# Patient Record
Sex: Male | Born: 1950 | Race: Black or African American | Hispanic: No | Marital: Married | Smoking: Former smoker
Health system: Southern US, Community
[De-identification: ages and names within clinical notes are randomized; demographics above are authoritative.]

## PROBLEM LIST (undated history)

## (undated) DIAGNOSIS — Z87448 Personal history of other diseases of urinary system: Secondary | ICD-10-CM

## (undated) DIAGNOSIS — E785 Hyperlipidemia, unspecified: Secondary | ICD-10-CM

## (undated) DIAGNOSIS — M1990 Unspecified osteoarthritis, unspecified site: Secondary | ICD-10-CM

## (undated) DIAGNOSIS — Z85048 Personal history of other malignant neoplasm of rectum, rectosigmoid junction, and anus: Secondary | ICD-10-CM

## (undated) DIAGNOSIS — E119 Type 2 diabetes mellitus without complications: Secondary | ICD-10-CM

## (undated) DIAGNOSIS — N529 Male erectile dysfunction, unspecified: Secondary | ICD-10-CM

## (undated) DIAGNOSIS — Z973 Presence of spectacles and contact lenses: Secondary | ICD-10-CM

## (undated) DIAGNOSIS — I10 Essential (primary) hypertension: Secondary | ICD-10-CM

## (undated) DIAGNOSIS — N134 Hydroureter: Secondary | ICD-10-CM

## (undated) DIAGNOSIS — R319 Hematuria, unspecified: Secondary | ICD-10-CM

## (undated) HISTORY — PX: OTHER SURGICAL HISTORY: SHX169

---

## 1992-04-14 HISTORY — PX: BOWEL RESECTION: SHX1257

## 1997-08-23 ENCOUNTER — Other Ambulatory Visit: Admission: RE | Admit: 1997-08-23 | Discharge: 1997-08-23 | Payer: Self-pay | Admitting: Internal Medicine

## 1997-10-02 ENCOUNTER — Encounter: Admission: RE | Admit: 1997-10-02 | Discharge: 1997-12-31 | Payer: Self-pay | Admitting: Internal Medicine

## 1998-04-16 ENCOUNTER — Ambulatory Visit (HOSPITAL_BASED_OUTPATIENT_CLINIC_OR_DEPARTMENT_OTHER): Admission: RE | Admit: 1998-04-16 | Discharge: 1998-04-16 | Payer: Self-pay | Admitting: General Surgery

## 1998-04-17 ENCOUNTER — Emergency Department (HOSPITAL_COMMUNITY): Admission: EM | Admit: 1998-04-17 | Discharge: 1998-04-17 | Payer: Self-pay | Admitting: Emergency Medicine

## 1998-04-20 ENCOUNTER — Encounter: Payer: Self-pay | Admitting: General Surgery

## 1998-04-20 ENCOUNTER — Ambulatory Visit (HOSPITAL_COMMUNITY): Admission: RE | Admit: 1998-04-20 | Discharge: 1998-04-20 | Payer: Self-pay | Admitting: General Surgery

## 1998-04-25 ENCOUNTER — Encounter: Admission: RE | Admit: 1998-04-25 | Discharge: 1998-07-24 | Payer: Self-pay | Admitting: *Deleted

## 1998-05-04 ENCOUNTER — Ambulatory Visit (HOSPITAL_COMMUNITY): Admission: RE | Admit: 1998-05-04 | Discharge: 1998-05-04 | Payer: Self-pay | Admitting: General Surgery

## 1998-05-04 ENCOUNTER — Encounter: Payer: Self-pay | Admitting: General Surgery

## 1998-07-25 ENCOUNTER — Encounter: Admission: RE | Admit: 1998-07-25 | Discharge: 1998-10-23 | Payer: Self-pay | Admitting: *Deleted

## 1998-08-03 ENCOUNTER — Emergency Department (HOSPITAL_COMMUNITY): Admission: EM | Admit: 1998-08-03 | Discharge: 1998-08-03 | Payer: Self-pay | Admitting: Emergency Medicine

## 1998-08-06 ENCOUNTER — Inpatient Hospital Stay (HOSPITAL_COMMUNITY): Admission: EM | Admit: 1998-08-06 | Discharge: 1998-08-07 | Payer: Self-pay | Admitting: Emergency Medicine

## 1998-10-19 ENCOUNTER — Ambulatory Visit (HOSPITAL_BASED_OUTPATIENT_CLINIC_OR_DEPARTMENT_OTHER): Admission: RE | Admit: 1998-10-19 | Discharge: 1998-10-19 | Payer: Self-pay | Admitting: General Surgery

## 1998-11-23 ENCOUNTER — Ambulatory Visit: Admission: RE | Admit: 1998-11-23 | Discharge: 1998-11-23 | Payer: Self-pay | Admitting: Oncology

## 1998-11-23 ENCOUNTER — Encounter: Payer: Self-pay | Admitting: Oncology

## 1999-04-29 ENCOUNTER — Encounter: Admission: RE | Admit: 1999-04-29 | Discharge: 1999-04-29 | Payer: Self-pay | Admitting: Hematology and Oncology

## 1999-04-29 ENCOUNTER — Encounter: Payer: Self-pay | Admitting: Hematology and Oncology

## 1999-05-14 ENCOUNTER — Other Ambulatory Visit: Admission: RE | Admit: 1999-05-14 | Discharge: 1999-05-14 | Payer: Self-pay | Admitting: General Surgery

## 1999-07-29 ENCOUNTER — Encounter: Admission: RE | Admit: 1999-07-29 | Discharge: 1999-07-29 | Payer: Self-pay | Admitting: Oncology

## 1999-07-29 ENCOUNTER — Encounter: Payer: Self-pay | Admitting: Oncology

## 1999-10-21 ENCOUNTER — Encounter: Payer: Self-pay | Admitting: Oncology

## 1999-10-21 ENCOUNTER — Ambulatory Visit (HOSPITAL_COMMUNITY): Admission: RE | Admit: 1999-10-21 | Discharge: 1999-10-21 | Payer: Self-pay | Admitting: Oncology

## 2000-04-23 ENCOUNTER — Encounter: Payer: Self-pay | Admitting: Oncology

## 2000-04-23 ENCOUNTER — Encounter: Admission: RE | Admit: 2000-04-23 | Discharge: 2000-04-23 | Payer: Self-pay | Admitting: Oncology

## 2000-07-21 ENCOUNTER — Ambulatory Visit (HOSPITAL_COMMUNITY): Admission: RE | Admit: 2000-07-21 | Discharge: 2000-07-21 | Payer: Self-pay | Admitting: Gastroenterology

## 2000-10-06 ENCOUNTER — Encounter: Payer: Self-pay | Admitting: Emergency Medicine

## 2000-10-06 ENCOUNTER — Emergency Department (HOSPITAL_COMMUNITY): Admission: EM | Admit: 2000-10-06 | Discharge: 2000-10-07 | Payer: Self-pay | Admitting: Emergency Medicine

## 2000-10-13 ENCOUNTER — Encounter: Payer: Self-pay | Admitting: Oncology

## 2000-10-13 ENCOUNTER — Ambulatory Visit (HOSPITAL_COMMUNITY): Admission: RE | Admit: 2000-10-13 | Discharge: 2000-10-13 | Payer: Self-pay | Admitting: Oncology

## 2001-03-29 ENCOUNTER — Encounter: Payer: Self-pay | Admitting: Oncology

## 2001-03-29 ENCOUNTER — Ambulatory Visit (HOSPITAL_COMMUNITY): Admission: RE | Admit: 2001-03-29 | Discharge: 2001-03-29 | Payer: Self-pay | Admitting: Oncology

## 2002-03-28 ENCOUNTER — Ambulatory Visit (HOSPITAL_COMMUNITY): Admission: RE | Admit: 2002-03-28 | Discharge: 2002-03-28 | Payer: Self-pay | Admitting: Oncology

## 2002-03-28 ENCOUNTER — Encounter: Payer: Self-pay | Admitting: Oncology

## 2003-06-16 ENCOUNTER — Ambulatory Visit (HOSPITAL_COMMUNITY): Admission: RE | Admit: 2003-06-16 | Discharge: 2003-06-16 | Payer: Self-pay | Admitting: Gastroenterology

## 2004-03-22 ENCOUNTER — Ambulatory Visit: Payer: Self-pay | Admitting: Oncology

## 2005-03-21 ENCOUNTER — Ambulatory Visit: Payer: Self-pay | Admitting: Oncology

## 2006-03-19 ENCOUNTER — Ambulatory Visit: Payer: Self-pay | Admitting: Oncology

## 2006-04-29 LAB — FECAL OCCULT BLOOD, GUAIAC: Occult Blood: NEGATIVE

## 2007-03-18 ENCOUNTER — Ambulatory Visit: Payer: Self-pay | Admitting: Oncology

## 2007-09-22 ENCOUNTER — Ambulatory Visit (HOSPITAL_BASED_OUTPATIENT_CLINIC_OR_DEPARTMENT_OTHER): Admission: RE | Admit: 2007-09-22 | Discharge: 2007-09-22 | Payer: Self-pay | Admitting: Urology

## 2007-09-22 HISTORY — PX: OTHER SURGICAL HISTORY: SHX169

## 2008-04-18 ENCOUNTER — Ambulatory Visit: Payer: Self-pay | Admitting: Oncology

## 2009-04-16 ENCOUNTER — Ambulatory Visit: Payer: Self-pay | Admitting: Oncology

## 2010-04-17 ENCOUNTER — Ambulatory Visit: Payer: Self-pay | Admitting: Oncology

## 2010-08-27 NOTE — Op Note (Signed)
NAMESTEVENS, Gerald Knight               ACCOUNT NO.:  [id number]   MEDICAL RECORD NO.:  [id number]          PATIENT TYPE:  AMB   LOCATION:  [facility]                         FACILITY:  [facility]   PHYSICIAN:  Courtney Paris, M.D.DATE OF BIRTH:  1951-02-13   DATE OF PROCEDURE:  09/22/2007  DATE OF DISCHARGE:                               OPERATIVE REPORT   PREOPERATIVE DIAGNOSIS:  Urethral stricture.   POSTOPERATIVE DIAGNOSIS:  Urethral stricture.   OPERATION:  Direct vision internal urethrotomy.   ANESTHESIA:  General.   SURGEON:  Courtney Paris, M.D.   This 60 year old patient presented with hematuria.  He was found to have  a urethral stricture, and he is admitted now for a DVIU.  He had some  kind of posterior bowel resection by Dr. Elige Radon in 1994.  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  He also has cancer of the rectum, treated with  radiation and chemo in 2000, with no evidence of disease.  He has a  family history of diabetes.  He does have some ED and is mildly  diabetic.   The patient was placed on the operating table in the dorsal lithotomy  position.  After satisfactory induction of general anesthesia, he was  prepped and draped with Betadine in the usual sterile fashion.  The  urethral meatus had to be dilated to accept the urethrotome.  This was  done up to 30-French sounds.  The scope was then passed down to the deep  bulbous urethra where a tight stricture was seen.  It was about 4-6  [place] in size.  It just accepted a #3 ureteral catheter.  Pictures were  made of the stricture.  After passing the ureteral catheter through the  stricture at 12 o'clock with the blade, I was able to incise this open  and then passed the scope into the bladder.  The stricture was a little  less than 1-cm long and it was just distal to the external sphincter.  He did have some moderate enlargement of the prostate, but I  saw no  evidence of any posterior urethral valves.  The bladder was entered.  The bladder seemed to be normal, mildly trabeculated.  No lesions seen.  As the scope was withdrawn again the stricture was incised at 12 o'clock  with fairly good hemostasis.  A final picture was made of the open  stricture and the scope was removed.   I tried to pass a #20 regular Foley catheter, but it did not seem to  want to go.  A #18 coude did go easily into the bladder.  The irrigant  was clear.  He was attached to a leg bag and he was sent to the recovery  room in good condition.   The plan is to leave the catheter for 2 weeks.  He will remove it at  that time and I will see him back in 3 weeks time.  He will be covered  with antibiotics for 3 days, and then again when the  catheter is  removed.  Detailed written instructions were given.      Courtney Paris, M.D.  Electronically Signed     HMK/MEDQ  D:  09/22/2007  T:  09/22/2007  Job:  [id number]

## 2011-01-09 LAB — CBC
HCT: 40.1
Hemoglobin: 13.7
MCHC: 34.2
MCV: 87
Platelets: 185
RBC: 4.61
RDW: 14.3
WBC: 4.7

## 2011-01-09 LAB — URINALYSIS, ROUTINE W REFLEX MICROSCOPIC
Bilirubin Urine: NEGATIVE
Glucose, UA: NEGATIVE
Hgb urine dipstick: NEGATIVE
Ketones, ur: NEGATIVE
Nitrite: NEGATIVE
Protein, ur: NEGATIVE
Specific Gravity, Urine: 1.01
Urobilinogen, UA: 0.2
pH: 6

## 2011-01-09 LAB — BASIC METABOLIC PANEL
BUN: 13
CO2: 28
Calcium: 9.6
Chloride: 103
Creatinine, Ser: 1.06
GFR calc Af Amer: >60
GFR calc non Af Amer: >60
Glucose, Bld: 145 — ABNORMAL HIGH
Potassium: 4.3
Sodium: 137

## 2011-01-09 LAB — URINE MICROSCOPIC-ADD ON: WBC, UA: NONE SEEN

## 2011-04-05 ENCOUNTER — Telehealth: Payer: Self-pay | Admitting: *Deleted

## 2011-04-05 NOTE — Telephone Encounter (Signed)
patient confirmed over the phone the new date and time on 05-06-2011 at 10:30am

## 2011-05-05 ENCOUNTER — Encounter: Payer: Self-pay | Admitting: *Deleted

## 2011-05-06 ENCOUNTER — Encounter: Payer: Self-pay | Admitting: Oncology

## 2011-05-06 ENCOUNTER — Ambulatory Visit (HOSPITAL_BASED_OUTPATIENT_CLINIC_OR_DEPARTMENT_OTHER): Payer: 59 | Admitting: Oncology

## 2011-05-06 VITALS — BP 138/89 | HR 62 | Temp 97.0°F | Ht 70.0 in | Wt 173.3 lb

## 2011-05-06 DIAGNOSIS — C210 Malignant neoplasm of anus, unspecified: Secondary | ICD-10-CM

## 2011-05-06 NOTE — Progress Notes (Signed)
OFFICE PROGRESS NOTE   INTERVAL HISTORY:   He returns as scheduled. He reports a good appetite. He denies difficulty with bowel and bladder function. He denies bleeding. He is now taking metformin for diabetes. He reports an intentional weight loss per  Objective:  Vital signs in last 24 hours:  Blood pressure 138/89, pulse 62, temperature 97 F (36.1 C), temperature source Oral, height 5\' 10"  (1.778 m), weight 173 lb 4.8 oz (78.608 kg).    HEENT: Slight prominence of the left compared to the right submandibular gland. Oropharynx without mass. Neck without mass. Lymphatics: No cervical, supraclavicular, axillary, inguinal, or femoral nodes Resp: Lungs clear bilaterally Cardio: Regular rate and rhythm GI: No hepatosplenomegaly, no mass Vascular: No leg edema  Rectal: Radiation hyperpigmentation/hypopigmentation at the perineum. No mass at the anal verge, anal canal, or rectum. The prostate appears enlarged and without nodularity.     Medications: I have reviewed the patient's current medications.  Assessment/Plan: 1. Anal cancer diagnosed in January 2000.  He completed treatment on the RTOG 9811 study.  He remains in clinical remission. 2.   History of Hemoccult-positive stool - status post a colonoscopy by Dr Zettie Pho in March 2010.  A polyp was removed.  He was referred for a repeat colonoscopy in 2012. We will followup on this report.  Disposition:  He remains in clinical remission from the anal cancer. He would like to continue followup at the cancer Center. He will return for an office visit in one year.   Lucile Shutters, MD  05/06/2011  11:57 AM

## 2011-08-27 ENCOUNTER — Ambulatory Visit
Admission: RE | Admit: 2011-08-27 | Discharge: 2011-08-27 | Disposition: A | Discharge status: Home / Self Care | Payer: 59 | Source: Ambulatory Visit | Attending: Internal Medicine | Admitting: Internal Medicine

## 2011-08-27 ENCOUNTER — Other Ambulatory Visit: Payer: Self-pay | Admitting: Internal Medicine

## 2011-08-27 DIAGNOSIS — R1900 Intra-abdominal and pelvic swelling, mass and lump, unspecified site: Secondary | ICD-10-CM

## 2011-08-28 ENCOUNTER — Ambulatory Visit
Admission: RE | Admit: 2011-08-28 | Discharge: 2011-08-28 | Disposition: A | Discharge status: Home / Self Care | Payer: 59 | Source: Ambulatory Visit | Attending: Internal Medicine | Admitting: Internal Medicine

## 2011-08-28 ENCOUNTER — Other Ambulatory Visit: Payer: 59

## 2011-08-28 MED ORDER — IOHEXOL 300 MG/ML  SOLN
100.0000 mL | Freq: Once | INTRAMUSCULAR | Status: AC | PRN
  Administered 2011-08-28: 100 mL via INTRAVENOUS

## 2012-04-12 ENCOUNTER — Telehealth: Payer: Self-pay | Admitting: Oncology

## 2012-04-12 ENCOUNTER — Other Ambulatory Visit: Payer: Self-pay | Admitting: *Deleted

## 2012-04-12 NOTE — Telephone Encounter (Signed)
Called patient left message appt has been moved from 05/04/12 to 2/20th MD only

## 2012-05-04 ENCOUNTER — Ambulatory Visit: Payer: 59 | Admitting: Oncology

## 2012-06-03 ENCOUNTER — Encounter: Payer: Self-pay | Admitting: Oncology

## 2012-06-03 ENCOUNTER — Telehealth: Payer: Self-pay | Admitting: Oncology

## 2012-06-03 ENCOUNTER — Ambulatory Visit (HOSPITAL_BASED_OUTPATIENT_CLINIC_OR_DEPARTMENT_OTHER): Payer: 59 | Admitting: Oncology

## 2012-06-03 VITALS — BP 139/85 | HR 63 | Temp 97.0°F | Resp 20 | Ht 70.0 in | Wt 172.4 lb

## 2012-06-03 DIAGNOSIS — C210 Malignant neoplasm of anus, unspecified: Secondary | ICD-10-CM | POA: Insufficient documentation

## 2012-06-03 NOTE — Progress Notes (Signed)
   [facility]    OFFICE PROGRESS NOTE   INTERVAL HISTORY:   He returns as scheduled. He feels well. He is followed closely by Dr. Valentina Lucks for management of diabetes. He is now followed by Dr. Margarita Grizzle in urology. No complaint.  Objective:  Vital signs in last 24 hours:  Blood pressure 139/85, pulse 63, temperature 97 F (36.1 C), temperature source Oral, resp. rate 20, height 5\' 10"  (1.778 m), weight 172 lb 6.4 oz (78.2 kg).    HEENT: Neck without mass Lymphatics: No cervical, supraclavicular, axillary, or inguinal nodes Resp: Lungs clear bilaterally Cardio: Regular rate and rhythm GI: No hepatosplenomegaly, nontender, radiation skin changes at the perineum. No nodules. Anal canal and rectum without mass. The prostate appears diffusely firm Vascular: No leg edema    Medications: I have reviewed the patient's current medications.  Assessment/Plan: 1. Anal cancer diagnosed in January 2000. He completed treatment on the RTOG 9811 study. He remains in clinical remission. 2. History of Hemoccult-positive stool - status post a colonoscopy by Dr Zettie Pho in March 2010. A polyp was removed. He was referred for a repeat colonoscopy in 2012.   Disposition:  Mr. Gerald Knight remains in clinical remission from anal cancer. He would like to continue followup at the cancer Center. He will return for an office visit in one year.   Thornton Papas, MD  06/03/2012  10:56 AM

## 2012-09-20 ENCOUNTER — Other Ambulatory Visit: Payer: Self-pay | Admitting: Urology

## 2012-09-20 ENCOUNTER — Encounter (HOSPITAL_BASED_OUTPATIENT_CLINIC_OR_DEPARTMENT_OTHER): Payer: Self-pay | Admitting: *Deleted

## 2012-09-27 ENCOUNTER — Ambulatory Visit (HOSPITAL_BASED_OUTPATIENT_CLINIC_OR_DEPARTMENT_OTHER): Admission: RE | Admit: 2012-09-27 | Payer: 59 | Source: Ambulatory Visit | Admitting: Urology

## 2012-09-27 ENCOUNTER — Encounter (HOSPITAL_BASED_OUTPATIENT_CLINIC_OR_DEPARTMENT_OTHER): Admission: RE | Payer: Self-pay | Source: Ambulatory Visit

## 2012-09-27 HISTORY — DX: Personal history of other malignant neoplasm of rectum, rectosigmoid junction, and anus: Z85.048

## 2012-09-27 SURGERY — CYSTOSCOPY, WITH URETHRAL DILATION
Anesthesia: General

## 2013-05-31 ENCOUNTER — Ambulatory Visit: Payer: 59 | Admitting: Oncology

## 2013-10-27 ENCOUNTER — Other Ambulatory Visit: Payer: Self-pay | Admitting: Urology

## 2013-11-16 ENCOUNTER — Encounter (HOSPITAL_BASED_OUTPATIENT_CLINIC_OR_DEPARTMENT_OTHER): Payer: Self-pay | Admitting: *Deleted

## 2013-11-16 NOTE — Progress Notes (Signed)
NPO AFTER MN. ARRIVE AT 0830. NEEDS ISTAT 8. WILL TAKE LIPITOR AM DOS W/ SIPS OF WATER.

## 2013-11-23 ENCOUNTER — Encounter (HOSPITAL_BASED_OUTPATIENT_CLINIC_OR_DEPARTMENT_OTHER)
Admission: RE | Disposition: A | Discharge status: Home / Self Care | Payer: Self-pay | Source: Ambulatory Visit | Attending: Urology

## 2013-11-23 ENCOUNTER — Ambulatory Visit (HOSPITAL_BASED_OUTPATIENT_CLINIC_OR_DEPARTMENT_OTHER)
Admission: RE | Admit: 2013-11-23 | Discharge: 2013-11-23 | Disposition: A | Discharge status: Home / Self Care | Payer: PRIVATE HEALTH INSURANCE | Source: Ambulatory Visit | Attending: Urology | Admitting: Urology

## 2013-11-23 ENCOUNTER — Encounter (HOSPITAL_BASED_OUTPATIENT_CLINIC_OR_DEPARTMENT_OTHER): Payer: PRIVATE HEALTH INSURANCE | Admitting: Anesthesiology

## 2013-11-23 ENCOUNTER — Ambulatory Visit (HOSPITAL_BASED_OUTPATIENT_CLINIC_OR_DEPARTMENT_OTHER): Payer: PRIVATE HEALTH INSURANCE | Admitting: Anesthesiology

## 2013-11-23 ENCOUNTER — Encounter (HOSPITAL_BASED_OUTPATIENT_CLINIC_OR_DEPARTMENT_OTHER): Payer: Self-pay | Admitting: *Deleted

## 2013-11-23 DIAGNOSIS — IMO0002 Reserved for concepts with insufficient information to code with codable children: Secondary | ICD-10-CM | POA: Diagnosis not present

## 2013-11-23 DIAGNOSIS — Z888 Allergy status to other drugs, medicaments and biological substances status: Secondary | ICD-10-CM | POA: Diagnosis not present

## 2013-11-23 DIAGNOSIS — R3129 Other microscopic hematuria: Secondary | ICD-10-CM | POA: Insufficient documentation

## 2013-11-23 DIAGNOSIS — Z9221 Personal history of antineoplastic chemotherapy: Secondary | ICD-10-CM | POA: Diagnosis not present

## 2013-11-23 DIAGNOSIS — N3289 Other specified disorders of bladder: Secondary | ICD-10-CM | POA: Diagnosis not present

## 2013-11-23 DIAGNOSIS — E119 Type 2 diabetes mellitus without complications: Secondary | ICD-10-CM | POA: Diagnosis not present

## 2013-11-23 DIAGNOSIS — Z923 Personal history of irradiation: Secondary | ICD-10-CM | POA: Insufficient documentation

## 2013-11-23 DIAGNOSIS — E785 Hyperlipidemia, unspecified: Secondary | ICD-10-CM | POA: Diagnosis not present

## 2013-11-23 DIAGNOSIS — Z85048 Personal history of other malignant neoplasm of rectum, rectosigmoid junction, and anus: Secondary | ICD-10-CM | POA: Insufficient documentation

## 2013-11-23 DIAGNOSIS — Z91041 Radiographic dye allergy status: Secondary | ICD-10-CM | POA: Insufficient documentation

## 2013-11-23 HISTORY — PX: CYSTOSCOPY W/ RETROGRADES: SHX1426

## 2013-11-23 HISTORY — DX: Hyperlipidemia, unspecified: E78.5

## 2013-11-23 HISTORY — DX: Personal history of other diseases of urinary system: Z87.448

## 2013-11-23 HISTORY — DX: Hematuria, unspecified: R31.9

## 2013-11-23 HISTORY — DX: Type 2 diabetes mellitus without complications: E11.9

## 2013-11-23 LAB — POCT I-STAT, CHEM 8
BUN: 15 mg/dL (ref 6–23)
Calcium, Ion: 1.26 mmol/L (ref 1.13–1.30)
Chloride: 103 mEq/L (ref 96–112)
Creatinine, Ser: 1 mg/dL (ref 0.50–1.35)
Glucose, Bld: 118 mg/dL — ABNORMAL HIGH (ref 70–99)
HCT: 43 % (ref 39.0–52.0)
Hemoglobin: 14.6 g/dL (ref 13.0–17.0)
Potassium: 3.9 mEq/L (ref 3.7–5.3)
Sodium: 141 mEq/L (ref 137–147)
TCO2: 24 mmol/L (ref 0–100)

## 2013-11-23 LAB — GLUCOSE, CAPILLARY: Glucose-Capillary: 126 mg/dL — ABNORMAL HIGH (ref 70–99)

## 2013-11-23 SURGERY — CYSTOSCOPY, WITH RETROGRADE PYELOGRAM
Anesthesia: General | Site: Bladder | Laterality: Bilateral

## 2013-11-23 MED ORDER — FENTANYL CITRATE 0.05 MG/ML IJ SOLN
INTRAMUSCULAR | Status: AC
  Filled 2013-11-23: qty 6

## 2013-11-23 MED ORDER — STERILE WATER FOR IRRIGATION IR SOLN
Status: DC | PRN
  Administered 2013-11-23: 3000 mL

## 2013-11-23 MED ORDER — MIDAZOLAM HCL 2 MG/2ML IJ SOLN
INTRAMUSCULAR | Status: AC
  Filled 2013-11-23: qty 2

## 2013-11-23 MED ORDER — LACTATED RINGERS IV SOLN
INTRAVENOUS | Status: DC
  Filled 2013-11-23: qty 1000

## 2013-11-23 MED ORDER — METOCLOPRAMIDE HCL 5 MG/ML IJ SOLN
10.0000 mg | Freq: Once | INTRAMUSCULAR | Status: DC | PRN
  Filled 2013-11-23: qty 2

## 2013-11-23 MED ORDER — GENTAMICIN IN SALINE 1.6-0.9 MG/ML-% IV SOLN
80.0000 mg | INTRAVENOUS | Status: AC
  Administered 2013-11-23: 390 mg via INTRAVENOUS
  Filled 2013-11-23: qty 50

## 2013-11-23 MED ORDER — FENTANYL CITRATE 0.05 MG/ML IJ SOLN
25.0000 ug | INTRAMUSCULAR | Status: DC | PRN
  Filled 2013-11-23: qty 1

## 2013-11-23 MED ORDER — OXYCODONE-ACETAMINOPHEN 5-325 MG PO TABS: 1.0000 | ORAL_TABLET | Freq: Four times a day (QID) | ORAL | Status: DC | PRN

## 2013-11-23 MED ORDER — MEPERIDINE HCL 25 MG/ML IJ SOLN
6.2500 mg | INTRAMUSCULAR | Status: DC | PRN
  Filled 2013-11-23: qty 1

## 2013-11-23 MED ORDER — LIDOCAINE HCL (CARDIAC) 20 MG/ML IV SOLN
INTRAVENOUS | Status: DC | PRN
  Administered 2013-11-23: 60 mg via INTRAVENOUS

## 2013-11-23 MED ORDER — MIDAZOLAM HCL 5 MG/5ML IJ SOLN
INTRAMUSCULAR | Status: DC | PRN
  Administered 2013-11-23: 2 mg via INTRAVENOUS

## 2013-11-23 MED ORDER — DEXAMETHASONE SODIUM PHOSPHATE 4 MG/ML IJ SOLN
INTRAMUSCULAR | Status: DC | PRN
  Administered 2013-11-23: 8 mg via INTRAVENOUS

## 2013-11-23 MED ORDER — LACTATED RINGERS IV SOLN
INTRAVENOUS | Status: DC | PRN
  Administered 2013-11-23: 09:00:00 via INTRAVENOUS

## 2013-11-23 MED ORDER — CEPHALEXIN 500 MG PO CAPS: 500.0000 mg | ORAL_CAPSULE | Freq: Two times a day (BID) | ORAL | Status: DC

## 2013-11-23 MED ORDER — ONDANSETRON HCL 4 MG/2ML IJ SOLN
INTRAMUSCULAR | Status: DC | PRN
  Administered 2013-11-23: 4 mg via INTRAVENOUS

## 2013-11-23 MED ORDER — BELLADONNA ALKALOIDS-OPIUM 16.2-60 MG RE SUPP
RECTAL | Status: AC
  Filled 2013-11-23: qty 1

## 2013-11-23 MED ORDER — SENNOSIDES-DOCUSATE SODIUM 8.6-50 MG PO TABS: 1.0000 | ORAL_TABLET | Freq: Two times a day (BID) | ORAL | Status: AC

## 2013-11-23 MED ORDER — PROPOFOL 10 MG/ML IV BOLUS
INTRAVENOUS | Status: DC | PRN
  Administered 2013-11-23: 180 mg via INTRAVENOUS

## 2013-11-23 MED ORDER — FENTANYL CITRATE 0.05 MG/ML IJ SOLN
INTRAMUSCULAR | Status: DC | PRN
  Administered 2013-11-23 (×2): 50 ug via INTRAVENOUS

## 2013-11-23 SURGICAL SUPPLY — 25 items
BAG URO CATCHER STRL LF (DRAPE) ×2 IMPLANT
BALLN NEPHROSTOMY (BALLOONS) ×2
BALLOON NEPHROSTOMY (BALLOONS) IMPLANT
BASKET ZERO TIP NITINOL 2.4FR (BASKET) IMPLANT
BSKT STON RTRVL ZERO TP 2.4FR (BASKET)
CATH FOLEY 2W COUNCIL 20FR 5CC (CATHETERS) ×1 IMPLANT
CATH FOLEY 2WAY SLVR  5CC 14FR (CATHETERS) ×1
CATH FOLEY 2WAY SLVR 5CC 14FR (CATHETERS) IMPLANT
CATH INTERMIT  6FR 70CM (CATHETERS) ×1 IMPLANT
CLOTH BEACON ORANGE TIMEOUT ST (SAFETY) ×2 IMPLANT
DRAPE CAMERA CLOSED 9X96 (DRAPES) ×2 IMPLANT
GLOVE BIO SURGEON STRL SZ 6 (GLOVE) ×1 IMPLANT
GLOVE BIO SURGEON STRL SZ7.5 (GLOVE) ×2 IMPLANT
GLOVE INDICATOR 6.5 STRL GRN (GLOVE) ×1 IMPLANT
GOWN PREVENTION PLUS XLARGE (GOWN DISPOSABLE) ×1 IMPLANT
GOWN STRL NON-REIN LRG LVL3 (GOWN DISPOSABLE) ×2 IMPLANT
GOWN STRL REUS W/ TWL LRG LVL3 (GOWN DISPOSABLE) IMPLANT
GOWN STRL REUS W/ TWL XL LVL3 (GOWN DISPOSABLE) IMPLANT
GOWN STRL REUS W/TWL LRG LVL3 (GOWN DISPOSABLE) ×2
GOWN STRL REUS W/TWL XL LVL3 (GOWN DISPOSABLE) ×2
GUIDEWIRE ANG ZIPWIRE 038X150 (WIRE) IMPLANT
GUIDEWIRE STR DUAL SENSOR (WIRE) ×2 IMPLANT
IV NS IRRIG 3000ML ARTHROMATIC (IV SOLUTION) ×1 IMPLANT
PACK CYSTOSCOPY (CUSTOM PROCEDURE TRAY) ×2 IMPLANT
WATER STERILE IRR 3000ML UROMA (IV SOLUTION) ×1 IMPLANT

## 2013-11-23 NOTE — Brief Op Note (Signed)
11/23/2013  10:37 AM  PATIENT:  Gerald Knight  63 y.o. male  PRE-OPERATIVE DIAGNOSIS:  HEMATURIA, HISTORY OF URETHRAL STRICTURE  POST-OPERATIVE DIAGNOSIS:  HEMATURIA, HISTORY OF URETHRAL STRICTURE  PROCEDURE:  Procedure(s): CYSTOSCOPY WITH RETROGRADE PYELOGRAM,  URETHRAL DILATION (Bilateral)  SURGEON:  Surgeon(s) and Role:    * Alexis Frock, MD - Primary  PHYSICIAN ASSISTANT:   ASSISTANTS: none   ANESTHESIA:   general  EBL:     BLOOD ADMINISTERED:none  DRAINS: 78F foley to straight drain   LOCAL MEDICATIONS USED:  NONE  SPECIMEN:  Source of Specimen:  none  DISPOSITION OF SPECIMEN:  N/A  COUNTS:  YES  TOURNIQUET:  * No tourniquets in log *  DICTATION: .Other Dictation: Dictation Number [id number]  PLAN OF CARE: Discharge to home after PACU  PATIENT DISPOSITION:  PACU - hemodynamically stable.   Delay start of Pharmacological VTE agent (>24hrs) due to surgical blood loss or risk of bleeding: not applicable

## 2013-11-23 NOTE — Anesthesia Postprocedure Evaluation (Signed)
  Anesthesia Post-op Note  Patient: Gerald Knight  Procedure(s) Performed: Procedure(s) (LRB): CYSTOSCOPY WITH RETROGRADE PYELOGRAM,  URETHRAL DILATION (Bilateral)  Patient Location: PACU  Anesthesia Type: General  Level of Consciousness: awake and alert   Airway and Oxygen Therapy: Patient Spontanous Breathing  Post-op Pain: mild  Post-op Assessment: Post-op Vital signs reviewed, Patient's Cardiovascular Status Stable, Respiratory Function Stable, Patent Airway and No signs of Nausea or vomiting  Last Vitals:  Filed Vitals:   11/23/13 1130  BP: 163/93  Pulse: 58  Temp:   Resp: 14    Post-op Vital Signs: stable   Complications: No apparent anesthesia complications

## 2013-11-23 NOTE — Transfer of Care (Signed)
Immediate Anesthesia Transfer of Care Note  Patient: Gerald Knight  Procedure(s) Performed: Procedure(s) (LRB): CYSTOSCOPY WITH RETROGRADE PYELOGRAM,  URETHRAL DILATION (Bilateral)  Patient Location: PACU  Anesthesia Type: General  Level of Consciousness: awake, oriented, sedated and patient cooperative  Airway & Oxygen Therapy: Patient Spontanous Breathing and Patient connected to face mask oxygen  Post-op Assessment: Report given to PACU RN and Post -op Vital signs reviewed and stable  Post vital signs: Reviewed and stable  Complications: No apparent anesthesia complications

## 2013-11-23 NOTE — Discharge Instructions (Signed)
CYSTOSCOPY HOME CARE INSTRUCTIONS  Activity: Rest for the remainder of the day.  Do not drive or operate equipment today.  You may resume normal activities in one to two days as instructed by your physician.   Meals: Drink plenty of liquids and eat light foods such as gelatin or soup this evening.  You may return to a normal meal plan tomorrow.  Return to Work: You may return to work in one to two days or as instructed by your physician.  Special Instructions / Symptoms: Call your physician if any of these symptoms occur:   -persistent or heavy bleeding  -bleeding which continues after first few urination  -large blood clots that are difficult to pass  -urine stream diminishes or stops completely  -fever equal to or higher than 101 degrees Farenheit.  -cloudy urine with a strong, foul odor  -severe pain  Females should always wipe from front to back after elimination.  You may feel some burning pain when you urinate.  This should disappear with time.  Applying moist heat to the lower abdomen or a hot tub bath may help relieve the pain. \  Follow-Up / Date of Return Visit to Your Physician:  *** Call for an appointment to arrange follow-up.  Patient Signature:  ________________________________________________________  Nurse's Signature:  ________________________________________________________  Indwelling Urinary Catheter Care You have been given a flexible tube (catheter) used to drain the bladder. Catheters are often used when a person has difficulty urinating due to blockage, bleeding, infection, or inability to control bladder or bowel movements (incontinence). A catheter requires daily care to prevent infection and blockage. HOME CARE INSTRUCTIONS  Do the following to reduce the risk of infection. Antibiotic medicines cannot prevent infections. Limit the number of bacteria entering your bladder  Wash your hands for 2 minutes with soapy water before and after handling the  catheter.  Wash your bottom and the entire catheter twice daily, as well as after each bowel movement. Wash the tip of the penis or just above the vaginal opening with soap and warm water, rinse, and then wash the rectal area. Always wash from front to back.  When changing from the leg bag to overnight bag or from the overnight bag to leg bag, thoroughly clean the end of the catheter where it connects to the tubing with an alcohol wipe.  Clean the leg bag and overnight bag daily after use. Replace your drainage bags weekly.  Always keep the tubing and bag below the level of your bladder. This allows your urine to drain properly. Lifting the bag or tubing above the level of your bladder will cause dirty urine to flow back into your bladder. If you must briefly lift the bag higher than your bladder, pinch the catheter or tubing to prevent backflow.  Drink enough water and fluids to keep your urine clear or pale yellow, or as directed by your caregiver. This will flush bacteria out of the bladder. Protect tissues from injury  Attach the catheter to your leg so there is no tension on the catheter. Use adhesive tape or a leg strap. If you are using adhesive tape, remove any sticky residue left behind by the previous tape you used.  Place your leg bag on your lower leg. Fasten the straps securely and comfortably.  Do not remove the catheter yourself unless you have been instructed how to do so. Keep the urinary pathway open  Check throughout the day to be sure your catheter is working and urine  is draining freely. Make sure the tubing does not become kinked.  Do not let the drainage bag overfill. SEEK IMMEDIATE MEDICAL CARE IF:   The catheter becomes blocked. Urine is not draining.  Urine is leaking.  You have any pain.  You have a fever. Document Released: 03/31/2005 Document Revised: 03/17/2012 Document Reviewed: 08/30/2009 [facility] Patient Information 2015 [place], [place]. This  information is not intended to replace advice given to you by your health care provider. Make sure you discuss any questions you have with your health care provider.  Post Anesthesia Home Care Instructions  Activity: Get plenty of rest for the remainder of the day. A responsible adult should stay with you for 24 hours following the procedure.  For the next 24 hours, DO NOT: -Drive a car -Paediatric nurse -Drink alcoholic beverages -Take any medication unless instructed by your physician -Make any legal decisions or sign important papers.  Meals: Start with liquid foods such as gelatin or soup. Progress to regular foods as tolerated. Avoid greasy, spicy, heavy foods. If nausea and/or vomiting occur, drink only clear liquids until the nausea and/or vomiting subsides. Call your physician if vomiting continues.  Special Instructions/Symptoms: Your throat may feel dry or sore from the anesthesia or the breathing tube placed in your throat during surgery. If this causes discomfort, gargle with warm salt water. The discomfort should disappear within 24 hours.

## 2013-11-23 NOTE — Anesthesia Procedure Notes (Signed)
Procedure Name: LMA Insertion Date/Time: 11/23/2013 10:04 AM Performed by: Denna Haggard D Pre-anesthesia Checklist: Patient identified, Emergency Drugs available, Suction available and Patient being monitored Patient Re-evaluated:Patient Re-evaluated prior to inductionOxygen Delivery Method: Circle System Utilized Preoxygenation: Pre-oxygenation with 100% oxygen Intubation Type: IV induction Ventilation: Mask ventilation without difficulty LMA: LMA inserted LMA Size: 4.0 Number of attempts: 1 Airway Equipment and Method: bite block Placement Confirmation: positive ETCO2 Tube secured with: Tape Dental Injury: Teeth and Oropharynx as per pre-operative assessment

## 2013-11-23 NOTE — H&P (Signed)
Gerald Knight is an 63 y.o. male.    Chief Complaint: Pre-Op Cystoscopy, Retrograde Pyelograms, Possible Urethral Dilation  HPI:     1-  Urethral Stricture - s/p incision suspect valves 1994, DVIU 2009. PVR 2015 "39mL".  Location bulbar urethra, has previously prevented office cysto.  2 - MIcroscopic Hematuria - Former 10PY smoker, s/ p chemo-XRT for rectal cancer. Eval 2015 with CT Urogram (cysts only), Cysto (pending).  3 - Lower Urinary Tract Symptoms - Lon h/o irritative symptoms not improved with several anticholinergic trials. PVR 2015 "60mL". He has h/o prior pelvic radiation  PMH sig for DM2. No CV disease. No strong blood thinners. His PCP is Gerald Knight with Gerald Knight.   Today Gerald Knight is seen to proceed with elective operative cystoscopy and possible urethral dialtion as he refuses office cysto given previously unsuccesfull due to stricture disease.    Past Medical History  Diagnosis Date  . History of rectal or anal cancer     JAN 2000  [place] --  ONCOLOGIST ; DR Gerald Knight  . Hyperlipidemia   . Type 2 diabetes mellitus   . History of urethral stricture   . Hematuria     Past Surgical History  Procedure Laterality Date  . Cysto w/  dilation of bladder neck contracture   LAST ONE SEPT 2005    MULTIPLE  . Cysto/ direct vision internal urethrotomy  09-22-2007  . Bowel resection  1994    POSTERIOR    History reviewed. No pertinent family history. Social History:  reports that he quit smoking about 20 years ago. His smoking use included Cigarettes. He smoked 0.00 packs per day for 10 years. He has never used smokeless tobacco. He reports that he does not drink alcohol or use illicit drugs.  Allergies:  Allergies  Allergen Reactions  . Ivp Dye [Iodinated Diagnostic Agents] Hives    Was over 30 years ago...  . Promethazine Other (See Comments)    Makes hyper     No prescriptions prior to admission    No results found for this or any  previous visit (from the past 48 hour(s)). No results found.  Review of Systems  Constitutional: Negative.   HENT: Negative.   Eyes: Negative.   Respiratory: Negative.   Cardiovascular: Negative.   Gastrointestinal: Negative.   Genitourinary: Negative.   Musculoskeletal: Negative.   Skin: Negative.   Neurological: Negative.   Endo/Heme/Allergies: Negative.   Psychiatric/Behavioral: Negative.     Height 5\' 10"  (1.778 m), weight 77.111 kg (170 lb). Physical Exam  Constitutional: He is oriented to person, place, and time. He appears well-developed and well-nourished.  HENT:  Head: Normocephalic and atraumatic.  Eyes: Pupils are equal, round, and reactive to light.  Neck: Normal range of motion. Neck supple.  Cardiovascular: Normal rate.   Respiratory: Effort normal and breath sounds normal.  GI: Soft. Bowel sounds are normal.  Genitourinary: Penis normal.  No CVAT  Musculoskeletal: Normal range of motion.  Neurological: He is alert and oriented to person, place, and time.  Skin: Skin is warm and dry.  Psychiatric: He has a normal mood and affect. His behavior is normal. Judgment and thought content normal.     Assessment/Plan  1-  Urethral Stricture - PVR minimal, but recurrence ceratinly possible as could not tollerate prior office cysto. Discussed recommendation of operative cysto, retrogrades, and possible dilation for this and to complete hematuria eval and he wants to proceed.   Risks including bleeding, infection,  damage to urethra, recurrence of stricture discussed as well as rare risks such as DVT, PE, MI, CVA, Mortality. Will plan as outpatient. Also discussed that if formal urethral dilation required he may need foley x few days. He voiced understanding and wants to proceed.   2 - MIcroscopic Hematuria - Partial eval complete. Offered re-attempt office cysto v. operative. and he opts for operative cysto as per above.  3 - Lower Urinary Tract Symptoms - no response  to anticholinergics. Suspect possible stricture and / or radiation cystitis changes, rec cysto.    Gerald Knight 11/23/2013, 6:45 AM

## 2013-11-23 NOTE — Anesthesia Preprocedure Evaluation (Addendum)
Anesthesia Evaluation  Patient identified by MRN, date of birth, ID band Patient awake    Reviewed: Allergy & Precautions, H&P , NPO status , Patient's Chart, lab work & pertinent test results  Airway Mallampati: II TM Distance: >3 FB Neck ROM: Full    Dental no notable dental hx.    Pulmonary former smoker,  breath sounds clear to auscultation  Pulmonary exam normal       Cardiovascular negative cardio ROS  Rhythm:Regular Rate:Normal     Neuro/Psych negative neurological ROS  negative psych ROS   GI/Hepatic negative GI ROS, Neg liver ROS,   Endo/Other  diabetes, Type 2, Oral Hypoglycemic Agents  Renal/GU negative Renal ROS  negative genitourinary   Musculoskeletal negative musculoskeletal ROS (+)   Abdominal   Peds negative pediatric ROS (+)  Hematology negative hematology ROS (+)   Anesthesia Other Findings   Reproductive/Obstetrics negative OB ROS                          Anesthesia Physical Anesthesia Plan  ASA: II  Anesthesia Plan: General   Post-op Pain Management:    Induction: Intravenous  Airway Management Planned: LMA  Additional Equipment:   Intra-op Plan:   Post-operative Plan: Extubation in OR  Informed Consent: I have reviewed the patients History and Physical, chart, labs and discussed the procedure including the risks, benefits and alternatives for the proposed anesthesia with the patient or authorized representative who has indicated his/her understanding and acceptance.   Dental advisory given  Plan Discussed with: CRNA  Anesthesia Plan Comments:         Anesthesia Quick Evaluation

## 2013-11-24 ENCOUNTER — Encounter (HOSPITAL_BASED_OUTPATIENT_CLINIC_OR_DEPARTMENT_OTHER): Payer: Self-pay | Admitting: Urology

## 2013-11-24 NOTE — Op Note (Signed)
Gerald Knight, Gerald Knight               ACCOUNT NO.:  [id number]  MEDICAL RECORD NO.:  [id number]  LOCATION:                                 FACILITY:  PHYSICIAN:  Alexis Frock, MD     DATE OF BIRTH:  12/27/1950  DATE OF PROCEDURE:  11/23/2013 DATE OF DISCHARGE:  11/23/2013                              OPERATIVE REPORT   DIAGNOSES:  Microscopic hematuria, history of urethral stricture.  PROCEDURE: 1. Cystoscopy with bilateral retrograde pyelogram and interpretation. 2. Balloon dilation of urethral stricture. 3. Placement of Foley catheter, complicated. 4. Retrograde urethrogram and interpretation.  ESTIMATED BLOOD LOSS:  Nil.  COMPLICATIONS:  None.  SPECIMENS:  None.  FINDINGS: 1. A pinhole membranous urethral stricture.  This was a short segment     approximately 5 mm by retrograde urethrogram, approximately 6-     French predilation, 30-French postdilation. 2. Mildly trabeculated urinary bladder. 3. Unremarkable bilateral retrograde pyelograms.  DRAINS:  A 20-French Councill catheter straight drain.  INDICATIONS:  Gerald Knight is a very pleasant 63 year old gentleman with history of recurrent urethral stricture status post prior dilation.  He developed persistent microscopic hematuria.  He underwent evaluation of this with CT urogram which was unremarkable; however, he did not have office cystoscopy as he refused as he has tolerated this poorly in the past given history of urethral stricture.  He wished to have this done in the operating room in case stricture was encountered again.  Informed consent was obtained and placed in medical record.  PROCEDURE IN DETAIL:  The patient being Gerald Knight, was verified. Procedure being cystoscopy with retrograde pyelograms, possible urethral dilation was confirmed.  Procedure was carried out.  Time-out was performed.  Intravenous antibiotics were administered.  General LMA anesthesia was introduced.  The patient was placed into a  low lithotomy position.  Sterile field was created by prepping and draping the patient's penis, perineum, and proximal thighs using iodine x3.  Next, cystourethroscopy was performed using a 22-French rigid cystoscope with 12-degree offset lens.  Inspection of the anterior urethra was unremarkable.  Inspection of the membranous urethra revealed high-grade dense appearing stricture approximately 6-French given size comparison to an open-ended catheter.  Given this finding, in order to document link to the stricture, a 14-French Foley catheter was placed with 1 mL sterile water in the fossa navicularis and retrograde urethrogram was obtained.  Retrograde urethrogram using Omnipaque 20 mL revealed short segment narrowing of the membranous urethra, approximately 5 mm in length. Given these findings, that this area was likely amenable to balloon dilation as such under direct vision.  A Sensor type wire was advanced through the stricture to the urinary bladder.  This was corroborated by fluoroscopy and the NephroMax 30-French balloon dilation apparatus was carefully advanced across the stricture and inflated to a pressure of 18 atmospheres, held for 90 seconds, and then released.  Rigid cystoscopy then revealed nicely open urinary channel all the way to the urinary bladder.  The prostatic fossa was unremarkable.  The urinary bladder was mildly trabeculated.  Ureteral orifices were somewhat inferior in Orientation.  The left ureter was cannulated with 6-French end-hole catheter and left retrograde pyelogram was obtained.  Left retrograde pyelogram demonstrates a single left ureter, single system left kidney.  No filling defects or narrowing noted.  There was likely some physiologic compression over the iliacs but again  prompt contrast excretion was seen and no hydroureteronephrosis was seen. Similarly, right retrograde pyelogram was obtained.  Right retrograde pyelogram demonstrated a  single right ureter, single system right kidney.  Similarly, there was relative physiologic compression at the area of the iliac crossing without mass and there is prompt excretion.  Thus, no worrisome findings for his hematuria workup were encountered.  Finally, a new 20-French Councill catheter was placed over the sensor working wire to the level of urinary bladder.  10 mL sterile water in the balloon.  This was connected to straight drain. Procedure was terminated.  The patient tolerated the procedure well. There were no immediate periprocedural complications.  The patient was taken to postanesthesia care unit in stable condition.          ______________________________ Alexis Frock, MD     TM/MEDQ  D:  11/23/2013  T:  11/23/2013  Job:  [id number]

## 2014-05-11 ENCOUNTER — Other Ambulatory Visit: Payer: Self-pay | Admitting: Internal Medicine

## 2014-05-11 ENCOUNTER — Ambulatory Visit
Admission: RE | Admit: 2014-05-11 | Discharge: 2014-05-11 | Disposition: A | Discharge status: Home / Self Care | Payer: PRIVATE HEALTH INSURANCE | Source: Ambulatory Visit | Attending: Internal Medicine | Admitting: Internal Medicine

## 2014-05-11 DIAGNOSIS — M25561 Pain in right knee: Secondary | ICD-10-CM

## 2016-04-14 HISTORY — PX: COLONOSCOPY: SHX174

## 2017-12-18 ENCOUNTER — Other Ambulatory Visit: Payer: Self-pay | Admitting: Urology

## 2017-12-18 ENCOUNTER — Encounter (HOSPITAL_BASED_OUTPATIENT_CLINIC_OR_DEPARTMENT_OTHER): Payer: Self-pay

## 2017-12-18 ENCOUNTER — Other Ambulatory Visit: Payer: Self-pay

## 2017-12-18 NOTE — Progress Notes (Signed)
Spoke with:  Josph Macho NPO:  After Midnight, no gum, candy, or mints   Arrival time:  0900AM Labs: Istat8, EKG AM medications:  Atorvastatin, Amlodipine Pre op orders:  Yes Ride home:  Romie Minus (wife) [phone]

## 2017-12-22 MED ORDER — DEXTROSE 5 % IV SOLN
5.0000 mg/kg | INTRAVENOUS | Status: AC
  Administered 2017-12-23: 400 mg via INTRAVENOUS
  Filled 2017-12-22 (×2): qty 10

## 2017-12-23 ENCOUNTER — Ambulatory Visit (HOSPITAL_BASED_OUTPATIENT_CLINIC_OR_DEPARTMENT_OTHER): Payer: Medicare Other | Admitting: Anesthesiology

## 2017-12-23 ENCOUNTER — Encounter (HOSPITAL_BASED_OUTPATIENT_CLINIC_OR_DEPARTMENT_OTHER)
Admission: RE | Disposition: A | Discharge status: Home / Self Care | Payer: Self-pay | Source: Ambulatory Visit | Attending: Urology

## 2017-12-23 ENCOUNTER — Encounter (HOSPITAL_BASED_OUTPATIENT_CLINIC_OR_DEPARTMENT_OTHER): Payer: Self-pay | Admitting: *Deleted

## 2017-12-23 ENCOUNTER — Ambulatory Visit (HOSPITAL_BASED_OUTPATIENT_CLINIC_OR_DEPARTMENT_OTHER)
Admission: RE | Admit: 2017-12-23 | Discharge: 2017-12-23 | Disposition: A | Discharge status: Home / Self Care | Payer: Medicare Other | Source: Ambulatory Visit | Attending: Urology | Admitting: Urology

## 2017-12-23 DIAGNOSIS — Z85048 Personal history of other malignant neoplasm of rectum, rectosigmoid junction, and anus: Secondary | ICD-10-CM | POA: Diagnosis not present

## 2017-12-23 DIAGNOSIS — Z7984 Long term (current) use of oral hypoglycemic drugs: Secondary | ICD-10-CM | POA: Insufficient documentation

## 2017-12-23 DIAGNOSIS — I10 Essential (primary) hypertension: Secondary | ICD-10-CM | POA: Insufficient documentation

## 2017-12-23 DIAGNOSIS — Z923 Personal history of irradiation: Secondary | ICD-10-CM | POA: Insufficient documentation

## 2017-12-23 DIAGNOSIS — Z87891 Personal history of nicotine dependence: Secondary | ICD-10-CM | POA: Insufficient documentation

## 2017-12-23 DIAGNOSIS — E119 Type 2 diabetes mellitus without complications: Secondary | ICD-10-CM | POA: Insufficient documentation

## 2017-12-23 DIAGNOSIS — N35919 Unspecified urethral stricture, male, unspecified site: Secondary | ICD-10-CM | POA: Insufficient documentation

## 2017-12-23 HISTORY — DX: Presence of spectacles and contact lenses: Z97.3

## 2017-12-23 HISTORY — DX: Unspecified osteoarthritis, unspecified site: M19.90

## 2017-12-23 HISTORY — DX: Hydroureter: N13.4

## 2017-12-23 HISTORY — PX: CYSTOSCOPY WITH URETHRAL DILATATION: SHX5125

## 2017-12-23 HISTORY — DX: Male erectile dysfunction, unspecified: N52.9

## 2017-12-23 HISTORY — DX: Essential (primary) hypertension: I10

## 2017-12-23 LAB — POCT I-STAT, CHEM 8
BUN: 16 mg/dL (ref 8–23)
Calcium, Ion: 1.26 mmol/L (ref 1.15–1.40)
Chloride: 104 mmol/L (ref 98–111)
Creatinine, Ser: 1.2 mg/dL (ref 0.61–1.24)
Glucose, Bld: 118 mg/dL — ABNORMAL HIGH (ref 70–99)
HCT: 38 % — ABNORMAL LOW (ref 39.0–52.0)
Hemoglobin: 12.9 g/dL — ABNORMAL LOW (ref 13.0–17.0)
Potassium: 4 mmol/L (ref 3.5–5.1)
Sodium: 140 mmol/L (ref 135–145)
TCO2: 25 mmol/L (ref 22–32)

## 2017-12-23 SURGERY — CYSTOSCOPY, WITH URETHRAL DILATION
Anesthesia: General | Site: Renal

## 2017-12-23 MED ORDER — MIDAZOLAM HCL 2 MG/2ML IJ SOLN
INTRAMUSCULAR | Status: DC | PRN
  Administered 2017-12-23: 1 mg via INTRAVENOUS

## 2017-12-23 MED ORDER — TRAMADOL HCL 50 MG PO TABS
ORAL_TABLET | ORAL | Status: AC
  Filled 2017-12-23: qty 1

## 2017-12-23 MED ORDER — LACTATED RINGERS IV SOLN
INTRAVENOUS | Status: DC
  Administered 2017-12-23: 10:00:00 via INTRAVENOUS
  Filled 2017-12-23: qty 1000

## 2017-12-23 MED ORDER — ONDANSETRON HCL 4 MG/2ML IJ SOLN
INTRAMUSCULAR | Status: DC | PRN
  Administered 2017-12-23: 4 mg via INTRAVENOUS

## 2017-12-23 MED ORDER — FENTANYL CITRATE (PF) 100 MCG/2ML IJ SOLN
INTRAMUSCULAR | Status: DC | PRN
  Administered 2017-12-23: 50 ug via INTRAVENOUS

## 2017-12-23 MED ORDER — ONDANSETRON HCL 4 MG/2ML IJ SOLN
INTRAMUSCULAR | Status: AC
  Filled 2017-12-23: qty 2

## 2017-12-23 MED ORDER — IOHEXOL 300 MG/ML  SOLN
INTRAMUSCULAR | Status: DC | PRN
  Administered 2017-12-23: 10 mL

## 2017-12-23 MED ORDER — DEXAMETHASONE SODIUM PHOSPHATE 10 MG/ML IJ SOLN
INTRAMUSCULAR | Status: AC
  Filled 2017-12-23: qty 1

## 2017-12-23 MED ORDER — CEPHALEXIN 500 MG PO CAPS: 500.0000 mg | ORAL_CAPSULE | Freq: Two times a day (BID) | ORAL | 0 refills | Status: AC

## 2017-12-23 MED ORDER — PROPOFOL 10 MG/ML IV BOLUS
INTRAVENOUS | Status: AC
  Filled 2017-12-23: qty 20

## 2017-12-23 MED ORDER — LIDOCAINE 2% (20 MG/ML) 5 ML SYRINGE
INTRAMUSCULAR | Status: DC | PRN
  Administered 2017-12-23: 60 mg via INTRAVENOUS

## 2017-12-23 MED ORDER — LIDOCAINE 2% (20 MG/ML) 5 ML SYRINGE
INTRAMUSCULAR | Status: AC
  Filled 2017-12-23: qty 5

## 2017-12-23 MED ORDER — FENTANYL CITRATE (PF) 100 MCG/2ML IJ SOLN
INTRAMUSCULAR | Status: AC
  Filled 2017-12-23: qty 2

## 2017-12-23 MED ORDER — FENTANYL CITRATE (PF) 100 MCG/2ML IJ SOLN
25.0000 ug | INTRAMUSCULAR | Status: DC | PRN
  Filled 2017-12-23: qty 1

## 2017-12-23 MED ORDER — TRAMADOL HCL 50 MG PO TABS
50.0000 mg | ORAL_TABLET | Freq: Once | ORAL | Status: AC
  Administered 2017-12-23: 50 mg via ORAL
  Filled 2017-12-23: qty 1

## 2017-12-23 MED ORDER — MIDAZOLAM HCL 2 MG/2ML IJ SOLN
INTRAMUSCULAR | Status: AC
  Filled 2017-12-23: qty 2

## 2017-12-23 MED ORDER — ONDANSETRON HCL 4 MG/2ML IJ SOLN
4.0000 mg | Freq: Once | INTRAMUSCULAR | Status: DC | PRN
  Filled 2017-12-23: qty 2

## 2017-12-23 MED ORDER — STERILE WATER FOR IRRIGATION IR SOLN
Status: DC | PRN
  Administered 2017-12-23: 3000 mL

## 2017-12-23 MED ORDER — PROPOFOL 10 MG/ML IV BOLUS
INTRAVENOUS | Status: DC | PRN
  Administered 2017-12-23: 150 mg via INTRAVENOUS

## 2017-12-23 MED ORDER — TRAMADOL HCL 50 MG PO TABS: 50.0000 mg | ORAL_TABLET | Freq: Four times a day (QID) | ORAL | 0 refills | Status: AC | PRN

## 2017-12-23 SURGICAL SUPPLY — 35 items
BAG DRAIN URO-CYSTO SKYTR STRL (DRAIN) ×2 IMPLANT
BAG DRN ANRFLXCHMBR STRAP LEK (BAG) ×1
BAG DRN UROCATH (DRAIN) ×1
BAG URINE DRAINAGE (UROLOGICAL SUPPLIES) IMPLANT
BAG URINE LEG 19OZ MD ST LTX (BAG) ×1 IMPLANT
BALLN NEPHROSTOMY (BALLOONS) ×2
BALLOON NEPHROSTOMY (BALLOONS) IMPLANT
CATH FOLEY 2W COUNCIL 20FR 5CC (CATHETERS) IMPLANT
CATH FOLEY 2W COUNCIL 5CC 16FR (CATHETERS) IMPLANT
CATH FOLEY 2W COUNCIL 5CC 18FR (CATHETERS) IMPLANT
CATH FOLEY 2WAY  3CC 10FR (CATHETERS)
CATH FOLEY 2WAY 3CC 10FR (CATHETERS) IMPLANT
CATH FOLEY 2WAY SLVR  5CC 22FR (CATHETERS) ×1
CATH FOLEY 2WAY SLVR 5CC 22FR (CATHETERS) IMPLANT
CATH ROBINSON RED A/P 14FR (CATHETERS) IMPLANT
CLOTH BEACON ORANGE TIMEOUT ST (SAFETY) ×4 IMPLANT
ELECT REM PT RETURN 9FT ADLT (ELECTROSURGICAL) ×2
ELECTRODE REM PT RTRN 9FT ADLT (ELECTROSURGICAL) ×1 IMPLANT
GLOVE BIO SURGEON STRL SZ 6.5 (GLOVE) ×1 IMPLANT
GLOVE BIO SURGEON STRL SZ7.5 (GLOVE) ×2 IMPLANT
GLOVE BIOGEL PI IND STRL 6.5 (GLOVE) IMPLANT
GLOVE BIOGEL PI INDICATOR 6.5 (GLOVE) ×2
GOWN STRL REUS W/ TWL LRG LVL3 (GOWN DISPOSABLE) ×3 IMPLANT
GOWN STRL REUS W/TWL LRG LVL3 (GOWN DISPOSABLE) ×4
GUIDEWIRE ANG ZIPWIRE 038X150 (WIRE) ×2 IMPLANT
GUIDEWIRE STR DUAL SENSOR (WIRE) IMPLANT
HOLDER FOLEY CATH W/STRAP (MISCELLANEOUS) IMPLANT
KIT TURNOVER CYSTO (KITS) ×2 IMPLANT
MANIFOLD NEPTUNE II (INSTRUMENTS) ×1 IMPLANT
NDL HYPO 18GX1.5 BLUNT FILL (NEEDLE) IMPLANT
NEEDLE HYPO 18GX1.5 BLUNT FILL (NEEDLE) IMPLANT
PACK CYSTO (CUSTOM PROCEDURE TRAY) ×2 IMPLANT
SYR 20CC LL (SYRINGE) IMPLANT
TUBE CONNECTING 12X1/4 (SUCTIONS) IMPLANT
WATER STERILE IRR 3000ML UROMA (IV SOLUTION) ×2 IMPLANT

## 2017-12-23 NOTE — Discharge Instructions (Signed)
1 - You may have urinary urgency (bladder spasms) and bloody urine on / off with catheter in place. This is normal.  2 - Call MD or go to ER for fever >102, severe pain / nausea / vomiting not relieved by medications, or acute change in medical status   Post Anesthesia Home Care Instructions  Activity: Get plenty of rest for the remainder of the day. A responsible individual must stay with you for 24 hours following the procedure.  For the next 24 hours, DO NOT: -Drive a car -Paediatric nurse -Drink alcoholic beverages -Take any medication unless instructed by your physician -Make any legal decisions or sign important papers.  Meals: Start with liquid foods such as gelatin or soup. Progress to regular foods as tolerated. Avoid greasy, spicy, heavy foods. If nausea and/or vomiting occur, drink only clear liquids until the nausea and/or vomiting subsides. Call your physician if vomiting continues.  Special Instructions/Symptoms: Your throat may feel dry or sore from the anesthesia or the breathing tube placed in your throat during surgery. If this causes discomfort, gargle with warm salt water. The discomfort should disappear within 24 hours.

## 2017-12-23 NOTE — Anesthesia Procedure Notes (Signed)
Procedure Name: LMA Insertion Date/Time: 12/23/2017 10:57 AM Performed by: Wanita Chamberlain, CRNA Pre-anesthesia Checklist: Patient being monitored, Suction available, Emergency Drugs available, Patient identified and Timeout performed Patient Re-evaluated:Patient Re-evaluated prior to induction Oxygen Delivery Method: Circle system utilized Preoxygenation: Pre-oxygenation with 100% oxygen Induction Type: IV induction Ventilation: Mask ventilation without difficulty LMA: LMA inserted LMA Size: 5.0 Number of attempts: 1 Placement Confirmation: breath sounds checked- equal and bilateral,  CO2 detector and positive ETCO2 Tube secured with: Tape Dental Injury: Teeth and Oropharynx as per pre-operative assessment

## 2017-12-23 NOTE — Anesthesia Preprocedure Evaluation (Addendum)
Anesthesia Evaluation  Patient identified by MRN, date of birth, ID band Patient awake    Reviewed: Allergy & Precautions, NPO status , Patient's Chart, lab work & pertinent test results  Airway Mallampati: II  TM Distance: >3 FB Neck ROM: Full    Dental  (+) Dental Advisory Given, Teeth Intact   Pulmonary former smoker,    breath sounds clear to auscultation       Cardiovascular hypertension, Pt. on medications  Rhythm:Regular Rate:Normal     Neuro/Psych negative neurological ROS     GI/Hepatic negative GI ROS, Neg liver ROS,   Endo/Other  diabetes, Type 2, Oral Hypoglycemic Agents  Renal/GU negative Renal ROS     Musculoskeletal  (+) Arthritis ,   Abdominal   Peds  Hematology negative hematology ROS (+)   Anesthesia Other Findings   Reproductive/Obstetrics                            Lab Results  Component Value Date   WBC 4.7 09/20/2007   HGB 12.9 (L) 12/23/2017   HCT 38.0 (L) 12/23/2017   MCV 87.0 09/20/2007   PLT 185 09/20/2007   Lab Results  Component Value Date   CREATININE 1.20 12/23/2017   BUN 16 12/23/2017   NA 140 12/23/2017   K 4.0 12/23/2017   CL 104 12/23/2017   CO2 28 09/20/2007    Anesthesia Physical Anesthesia Plan  ASA: II  Anesthesia Plan: General   Post-op Pain Management:    Induction: Intravenous  PONV Risk Score and Plan: 2 and Dexamethasone, Ondansetron and Treatment may vary due to age or medical condition  Airway Management Planned: LMA  Additional Equipment:   Intra-op Plan:   Post-operative Plan: Extubation in OR  Informed Consent: I have reviewed the patients History and Physical, chart, labs and discussed the procedure including the risks, benefits and alternatives for the proposed anesthesia with the patient or authorized representative who has indicated his/her understanding and acceptance.   Dental advisory given  Plan  Discussed with: CRNA  Anesthesia Plan Comments:         Anesthesia Quick Evaluation

## 2017-12-23 NOTE — Op Note (Signed)
NAME: Gerald Knight, Rowand MEDICAL RECORD [id number] ACCOUNT [id number] DATE OF BIRTH:May 30, 1950 FACILITY: [facility] LOCATION: [facility] PHYSICIAN:Mikaili Flippin, MD  OPERATIVE REPORT  DATE OF PROCEDURE:  12/23/2017  PREOPERATIVE DIAGNOSIS:  Recurrent urethral stricture, history of pelvic radiation.  PROCEDURE:  Cystoscopy with urethral dilation.  ESTIMATED BLOOD LOSS:  Nil.  COMPLICATIONS:  None.  SPECIMENS:  None.  FINDINGS: 1.  Short segment, but dense bulbar urethral stricture.  Estimate a 6-French predilation 24-French post-dilation and approximately 5 mm in depth. 2.  Resolution of all urethral strictures following dilation. 3.  Very fixed bladder neck and pelvis consistent with prior radiation. 4.  Unremarkable urinary bladder.  INDICATIONS:  The patient is a very pleasant 67 year old gentleman with history of rectal cancer status post chemoradiation.  After this, he has developed recurrent bulbar urethral stricture disease.  He has done well with p.r.n. dilation with dilation  interval of several years.  He has noted recurrence of obstructing irritative voiding symptoms and was found on evaluation to have recurrent stricture.  After discussion of management including suprapubic tube versus urethroplasty versus urethral  dilation and given his history of pelvic radiation and likely poor results from urethroplasty in the setting, he wished to proceed with a repeat urethral dilation.  Informed consent was obtained and placed in medical record.    DESCRIPTION OF PROCEDURE:  The patient was identified.  The procedure being urethral dilation was confirmed.  Procedure timeout was performed.  Intravenous antibiotics administered.  General LMA anesthesia introduced.  Cystoscopy performed using a  22-French rigid cystoscope with offset lens.  Inspection of the anterior and posterior urethra revealed a dense bulbar stricture.  Estimated 6-French predilation.  A sensor wire was advanced  across distal urinary bladder.  It was corroborated by spot  fluoroscopy over which a 24-French balloon dilation apparatus was carefully positioned across the stricture.  This was inflated to a pressure of 20 atmospheres with a slurry of saline and contrast to verify position with fluoroscopy, timed to 90 seconds  and then released and removed.  Cystoscopy then revealed resolution of the stricture with a minimal amount of mucosal bleeding from the dilation.  Inspection of the prostatic urethra was unremarkable.  Inspection of the bladder neck and bladder was  unremarkable other than the bladder neck being quite fixed, consistent with prior radiation.  The cystoscope was then removed and a new 22-French Foley catheter was placed free to straight drain, and the procedure terminated.  The patient tolerated the  procedure well.  No immediate preoperative complications.  The patient was taken to the postanesthesia care in stable condition.  TN/NUANCE  D:12/23/2017 T:12/23/2017 JOB:[id number]

## 2017-12-23 NOTE — Anesthesia Postprocedure Evaluation (Signed)
Anesthesia Post Note  Patient: Gerald Knight.  Procedure(s) Performed: CYSTOSCOPY WITH BALLOON URETHRAL DILATATION (N/A Renal)     Patient location during evaluation: PACU Anesthesia Type: General Level of consciousness: awake and alert Pain management: pain level controlled Vital Signs Assessment: post-procedure vital signs reviewed and stable Respiratory status: spontaneous breathing, nonlabored ventilation, respiratory function stable and patient connected to nasal cannula oxygen Cardiovascular status: blood pressure returned to baseline and stable Postop Assessment: no apparent nausea or vomiting Anesthetic complications: no    Last Vitals:  Vitals:   12/23/17 1215 12/23/17 1250  BP: (!) 146/93 137/88  Pulse: (!) 57 (!) 59  Resp: 14 16  Temp:  36.5 C  SpO2: 97% 99%    Last Pain:  Vitals:   12/23/17 1250  TempSrc: Oral  PainSc: 3                  Tiajuana Amass

## 2017-12-23 NOTE — Brief Op Note (Signed)
12/23/2017  11:11 AM  PATIENT:  Wynonia Lawman.  67 y.o. male  PRE-OPERATIVE DIAGNOSIS:  RECURRENT URETHRAL STRICTURE  POST-OPERATIVE DIAGNOSIS:  RECURRENT URETHRAL STRICTURE  PROCEDURE:  Procedure(s): CYSTOSCOPY WITH BALLOON URETHRAL DILATATION (N/A)  SURGEON:  Surgeon(s) and Role:    * Alexis Frock, MD - Primary  PHYSICIAN ASSISTANT:   ASSISTANTS: none   ANESTHESIA:   general  EBL:  minimal   BLOOD ADMINISTERED:none  DRAINS: 33F foley to gravity   LOCAL MEDICATIONS USED:  NONE  SPECIMEN:  No Specimen  DISPOSITION OF SPECIMEN:  N/A  COUNTS:  YES  TOURNIQUET:  * No tourniquets in log *  DICTATION: .Other Dictation: Dictation Number [id number]  PLAN OF CARE: Discharge to home after PACU  PATIENT DISPOSITION:  PACU - hemodynamically stable.   Delay start of Pharmacological VTE agent (>24hrs) due to surgical blood loss or risk of bleeding: yes

## 2017-12-23 NOTE — Transfer of Care (Signed)
Immediate Anesthesia Transfer of Care Note  Patient: Gerald Knight.  Procedure(s) Performed: CYSTOSCOPY WITH BALLOON URETHRAL DILATATION (N/A Renal)  Patient Location: PACU  Anesthesia Type:General  Level of Consciousness: awake, alert , oriented and patient cooperative  Airway & Oxygen Therapy: Patient Spontanous Breathing and Patient connected to nasal cannula oxygen  Post-op Assessment: Report given to RN and Post -op Vital signs reviewed and stable  Post vital signs: Reviewed and stable  Last Vitals:  Vitals Value Taken Time  BP    Temp    Pulse    Resp    SpO2      Last Pain:  Vitals:   12/23/17 0850  TempSrc: Oral         Complications: No apparent anesthesia complications

## 2017-12-23 NOTE — H&P (Signed)
Gerald Knight. is an 67 y.o. male.    Chief Complaint: Pre-Op Cysto / Urethral Dilation  HPI:   1- Urethral Stricture - s/p incision suspect valves 1994, DVIU 2009. PVR 2015 "73mL". Location bulbar urethra, has previously prevented office cysto. Operative cysto 11/2013 with impressive bulbar stricture that was short-segment and dilated. Had annual PVR survillancce until 2018 at which point reliably 0.   REcurrent obstructive symptoms 12/2017 and cysto confirms partial recurrence (estimate 43F lumen )    PMH sig for DM2, rectal cancer (chemo XRT). No CV disease. No strong blood thinners. His PCP is Lavone Orn MD with [facility].    Today Gerald Knight is seen to proceed with cysto and dilation for recurrent bulbar stricture. Most recent UA without infecitous parameters.    Past Medical History:  Diagnosis Date  . Arthritis    Knee  . ED (erectile dysfunction)   . Hematuria   . History of rectal or anal cancer    JAN 2000  [facility] --  ONCOLOGIST ; DR Benay Spice  . History of urethral stricture   . Hydroureter on right   . Hyperlipidemia   . Hypertension   . Type 2 diabetes mellitus ([place])   . Wears glasses     Past Surgical History:  Procedure Laterality Date  . BOWEL RESECTION  1994   POSTERIOR, patient unaware of this procedure  . COLONOSCOPY  2018  . 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 CYSTO/ DIRECT VISION INTERNAL URETHROTOMY  09-22-2007  . CYSTOSCOPY W/ RETROGRADES Bilateral 11/23/2013   Procedure: CYSTOSCOPY WITH RETROGRADE PYELOGRAM,  URETHRAL DILATION;  Surgeon: Alexis Frock, MD;  Location: [facility];  Service: Urology;  Laterality: Bilateral;    History reviewed. No pertinent family history. Social History:  reports that he quit smoking about 24 years ago. His smoking use included cigarettes. He quit after 10.00 years of use. He has never used smokeless tobacco. He reports that he does not  drink alcohol or use drugs.  Allergies:  Allergies  Allergen Reactions  . Ivp Dye [Iodinated Diagnostic Agents] Hives    Was over 30 years ago...  . Promethazine Other (See Comments)    Makes hyper     No medications prior to admission.    No results found for this or any previous visit (from the past 48 hour(s)). No results found.  Review of Systems  Constitutional: Negative.  Negative for chills and fever.  HENT: Negative.   Eyes: Negative.   Respiratory: Negative.   Cardiovascular: Negative.   Gastrointestinal: Negative.   Genitourinary: Negative.   Musculoskeletal: Negative.   Skin: Negative.   Neurological: Negative.   Endo/Heme/Allergies: Negative.   Psychiatric/Behavioral: Negative.     Height 5\' 10"  (1.778 m), weight 79.4 kg. Physical Exam  Constitutional: He appears well-developed.  HENT:  Head: Normocephalic.  Eyes: Pupils are equal, round, and reactive to light.  Neck: Normal range of motion.  Cardiovascular: Normal rate.  Respiratory: Effort normal.  GI: Soft.  Genitourinary:  Genitourinary Comments: No CVAT  Musculoskeletal: Normal range of motion.  Neurological: He is alert.  Skin: Skin is warm.  Psychiatric: He has a normal mood and affect.     Assessment/Plan  Proceed as planned with cysto and urethral dilation. Risks, benefits, alternatives, execpeted per-op course discussed previously and reiterated today.   Alexis Frock, MD 12/23/2017, 8:40 AM

## 2017-12-24 ENCOUNTER — Encounter (HOSPITAL_BASED_OUTPATIENT_CLINIC_OR_DEPARTMENT_OTHER): Payer: Self-pay | Admitting: Urology

## 2018-09-14 ENCOUNTER — Other Ambulatory Visit: Payer: Self-pay | Admitting: Internal Medicine

## 2018-09-14 ENCOUNTER — Other Ambulatory Visit: Payer: Self-pay

## 2018-09-14 ENCOUNTER — Ambulatory Visit
Admission: RE | Admit: 2018-09-14 | Discharge: 2018-09-14 | Disposition: A | Discharge status: Home / Self Care | Payer: Medicare Other | Source: Ambulatory Visit | Attending: Internal Medicine | Admitting: Internal Medicine

## 2018-09-14 DIAGNOSIS — M25512 Pain in left shoulder: Secondary | ICD-10-CM

## 2020-04-23 DIAGNOSIS — M179 Osteoarthritis of knee, unspecified: Secondary | ICD-10-CM | POA: Diagnosis not present

## 2020-04-23 DIAGNOSIS — I10 Essential (primary) hypertension: Secondary | ICD-10-CM | POA: Diagnosis not present

## 2020-04-23 DIAGNOSIS — K219 Gastro-esophageal reflux disease without esophagitis: Secondary | ICD-10-CM | POA: Diagnosis not present

## 2020-04-23 DIAGNOSIS — Z85038 Personal history of other malignant neoplasm of large intestine: Secondary | ICD-10-CM | POA: Diagnosis not present

## 2020-04-23 DIAGNOSIS — E1169 Type 2 diabetes mellitus with other specified complication: Secondary | ICD-10-CM | POA: Diagnosis not present

## 2020-04-23 DIAGNOSIS — E7800 Pure hypercholesterolemia, unspecified: Secondary | ICD-10-CM | POA: Diagnosis not present

## 2020-04-23 DIAGNOSIS — Z85048 Personal history of other malignant neoplasm of rectum, rectosigmoid junction, and anus: Secondary | ICD-10-CM | POA: Diagnosis not present

## 2020-07-08 ENCOUNTER — Other Ambulatory Visit: Payer: Self-pay

## 2020-07-08 ENCOUNTER — Emergency Department (HOSPITAL_COMMUNITY): Payer: Medicare Other

## 2020-07-08 ENCOUNTER — Encounter (HOSPITAL_COMMUNITY): Payer: Self-pay | Admitting: Emergency Medicine

## 2020-07-08 ENCOUNTER — Emergency Department (HOSPITAL_COMMUNITY)
Admission: EM | Admit: 2020-07-08 | Discharge: 2020-07-08 | Disposition: A | Discharge status: Home / Self Care | Payer: Medicare Other | Attending: Emergency Medicine | Admitting: Emergency Medicine

## 2020-07-08 DIAGNOSIS — Z20822 Contact with and (suspected) exposure to covid-19: Secondary | ICD-10-CM | POA: Diagnosis not present

## 2020-07-08 DIAGNOSIS — R531 Weakness: Secondary | ICD-10-CM | POA: Diagnosis not present

## 2020-07-08 DIAGNOSIS — Z7982 Long term (current) use of aspirin: Secondary | ICD-10-CM | POA: Insufficient documentation

## 2020-07-08 DIAGNOSIS — Z87891 Personal history of nicotine dependence: Secondary | ICD-10-CM | POA: Diagnosis not present

## 2020-07-08 DIAGNOSIS — R509 Fever, unspecified: Secondary | ICD-10-CM | POA: Insufficient documentation

## 2020-07-08 DIAGNOSIS — E119 Type 2 diabetes mellitus without complications: Secondary | ICD-10-CM | POA: Insufficient documentation

## 2020-07-08 DIAGNOSIS — Z7984 Long term (current) use of oral hypoglycemic drugs: Secondary | ICD-10-CM | POA: Diagnosis not present

## 2020-07-08 DIAGNOSIS — I10 Essential (primary) hypertension: Secondary | ICD-10-CM | POA: Insufficient documentation

## 2020-07-08 DIAGNOSIS — Z85048 Personal history of other malignant neoplasm of rectum, rectosigmoid junction, and anus: Secondary | ICD-10-CM | POA: Insufficient documentation

## 2020-07-08 DIAGNOSIS — R000 Tachycardia, unspecified: Secondary | ICD-10-CM | POA: Insufficient documentation

## 2020-07-08 DIAGNOSIS — Z79899 Other long term (current) drug therapy: Secondary | ICD-10-CM | POA: Insufficient documentation

## 2020-07-08 LAB — COMPREHENSIVE METABOLIC PANEL
ALT: 11 U/L (ref 0–44)
AST: 16 U/L (ref 15–41)
Albumin: 4.7 g/dL (ref 3.5–5.0)
Alkaline Phosphatase: 72 U/L (ref 38–126)
Anion gap: 12 (ref 5–15)
BUN: 29 mg/dL — ABNORMAL HIGH (ref 8–23)
CO2: 20 mmol/L — ABNORMAL LOW (ref 22–32)
Calcium: 9.4 mg/dL (ref 8.9–10.3)
Chloride: 104 mmol/L (ref 98–111)
Creatinine, Ser: 1.34 mg/dL — ABNORMAL HIGH (ref 0.61–1.24)
GFR, Estimated: 57 mL/min — ABNORMAL LOW (ref >60)
Glucose, Bld: 117 mg/dL — ABNORMAL HIGH (ref 70–99)
Potassium: 4 mmol/L (ref 3.5–5.1)
Sodium: 136 mmol/L (ref 135–145)
Total Bilirubin: 1.5 mg/dL — ABNORMAL HIGH (ref 0.3–1.2)
Total Protein: 7.7 g/dL (ref 6.5–8.1)

## 2020-07-08 LAB — LACTIC ACID, PLASMA: Lactic Acid, Venous: 1.2 mmol/L (ref 0.5–1.9)

## 2020-07-08 LAB — CBC WITH DIFFERENTIAL/PLATELET
Abs Immature Granulocytes: 0.08 10*3/uL — ABNORMAL HIGH (ref 0.00–0.07)
Basophils Absolute: 0 10*3/uL (ref 0.0–0.1)
Basophils Relative: 0 %
Eosinophils Absolute: 0 10*3/uL (ref 0.0–0.5)
Eosinophils Relative: 0 %
HCT: 35.2 % — ABNORMAL LOW (ref 39.0–52.0)
Hemoglobin: 12.4 g/dL — ABNORMAL LOW (ref 13.0–17.0)
Immature Granulocytes: 1 %
Lymphocytes Relative: 5 %
Lymphs Abs: 0.7 10*3/uL (ref 0.7–4.0)
MCH: 29.3 pg (ref 26.0–34.0)
MCHC: 35.2 g/dL (ref 30.0–36.0)
MCV: 83.2 fL (ref 80.0–100.0)
Monocytes Absolute: 0.8 10*3/uL (ref 0.1–1.0)
Monocytes Relative: 5 %
Neutro Abs: 13 10*3/uL — ABNORMAL HIGH (ref 1.7–7.7)
Neutrophils Relative %: 89 %
Platelets: 270 10*3/uL (ref 150–400)
RBC: 4.23 MIL/uL (ref 4.22–5.81)
RDW: 14.3 % (ref 11.5–15.5)
WBC: 14.6 10*3/uL — ABNORMAL HIGH (ref 4.0–10.5)
nRBC: 0 % (ref 0.0–0.2)

## 2020-07-08 LAB — URINALYSIS, ROUTINE W REFLEX MICROSCOPIC
Bacteria, UA: NONE SEEN
Bilirubin Urine: NEGATIVE
Glucose, UA: NEGATIVE mg/dL
Ketones, ur: NEGATIVE mg/dL
Leukocytes,Ua: NEGATIVE
Nitrite: NEGATIVE
Protein, ur: NEGATIVE mg/dL
Specific Gravity, Urine: 1.017 (ref 1.005–1.030)
pH: 5 (ref 5.0–8.0)

## 2020-07-08 LAB — LIPASE, BLOOD: Lipase: 32 U/L (ref 11–51)

## 2020-07-08 LAB — RESP PANEL BY RT-PCR (FLU A&B, COVID) ARPGX2
Influenza A by PCR: NEGATIVE
Influenza B by PCR: NEGATIVE
SARS Coronavirus 2 by RT PCR: NEGATIVE

## 2020-07-08 MED ORDER — ACETAMINOPHEN 500 MG PO TABS
1000.0000 mg | ORAL_TABLET | Freq: Once | ORAL | Status: AC
  Administered 2020-07-08: 1000 mg via ORAL
  Filled 2020-07-08: qty 2

## 2020-07-08 MED ORDER — LACTATED RINGERS IV BOLUS
1000.0000 mL | Freq: Once | INTRAVENOUS | Status: AC
  Administered 2020-07-08: 1000 mL via INTRAVENOUS

## 2020-07-08 NOTE — ED Notes (Signed)
Family at bedside. 

## 2020-07-08 NOTE — ED Triage Notes (Signed)
Patient here from home reporting fatigue and "not having my normal energy". States that he also had diarrhea on Saturday.

## 2020-07-08 NOTE — Discharge Instructions (Signed)
If you develop high fever, severe cough or cough with blood, trouble breathing, severe headache, neck pain/stiffness, vomiting, abdominal pain, rash, or any other new/concerning symptoms then return to the ER for evaluation

## 2020-07-08 NOTE — ED Provider Notes (Signed)
[facility] DEPT Provider Note   CSN: [id number] Arrival date & time: 07/08/20  1542     History Chief Complaint  Patient presents with  . Fatigue  . Diarrhea    Gerald Knight. is a 70 y.o. male.  HPI 70 year old male presents with generalized weakness.  Started yesterday.  He had diarrhea one time yesterday morning but no other diarrhea.  He has not had a fever, cough, chest pain, shortness of breath or abdominal pain.  No rash.  He had a slight headache yesterday but that is now resolved.  No sick contacts.  Has not eaten or drank much today due to poor appetite. No focal weakness.  Past Medical History:  Diagnosis Date  . Arthritis    Knee  . ED (erectile dysfunction)   . Hematuria   . History of rectal or anal cancer    JAN 2000  [place] --  ONCOLOGIST ; DR Benay Spice  . History of urethral stricture   . Hydroureter on right   . Hyperlipidemia   . Hypertension   . Type 2 diabetes mellitus ([place])   . Wears glasses     Patient Active Problem List   Diagnosis Date Noted  . Anal cancer ([place]) 06/03/2012    Past Surgical History:  Procedure Laterality Date  . BOWEL RESECTION  1994   POSTERIOR, patient unaware of this procedure  . COLONOSCOPY  2018  . 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 CYSTO/ DIRECT VISION INTERNAL URETHROTOMY  09-22-2007  . CYSTOSCOPY W/ RETROGRADES Bilateral 11/23/2013   Procedure: CYSTOSCOPY WITH RETROGRADE PYELOGRAM,  URETHRAL DILATION;  Surgeon: Alexis Frock, MD;  Location: [facility];  Service: Urology;  Laterality: Bilateral;  . CYSTOSCOPY WITH URETHRAL DILATATION N/A 12/23/2017   Procedure: CYSTOSCOPY WITH BALLOON URETHRAL DILATATION;  Surgeon: Alexis Frock, MD;  Location: [facility];  Service: Urology;  Laterality: N/A;       No family history on file.  Social History   Tobacco Use  . Smoking  status: Former Smoker    Years: 10.00    Types: Cigarettes    Quit date: 11/16/1993    Years since quitting: 26.6  . Smokeless tobacco: Never Used  Vaping Use  . Vaping Use: Never used  Substance Use Topics  . Alcohol use: No  . Drug use: No    Home Medications Prior to Admission medications   Medication Sig Start Date End Date Taking? Authorizing Provider  amLODipine (NORVASC) 10 MG tablet Take 10 mg by mouth daily.   Yes [provider]  aspirin 81 MG tablet Take 81 mg by mouth daily.    Yes [provider]  atorvastatin (LIPITOR) 10 MG tablet Take 10 mg by mouth every morning.    Yes [provider]  Cyanocobalamin (VITAMIN B12) 1000 MCG TBCR Take 1,000 mcg by mouth daily. 08/27/15  Yes [provider]  metFORMIN (GLUCOPHAGE) 1000 MG tablet Take 1,000 mg by mouth 2 (two) times daily with a meal.   Yes [provider]  telmisartan-hydrochlorothiazide (MICARDIS HCT) 80-12.5 MG tablet Take 1 tablet by mouth daily. 06/22/20  Yes [provider]  senna-docusate (SENOKOT-S) 8.6-50 MG per tablet Take 1 tablet by mouth 2 (two) times daily. While taking pain meds to prevent constipation Patient not taking: No sig reported 11/23/13   Alexis Frock, MD    Allergies  Ivp dye [iodinated diagnostic agents], Lisinopril, and Promethazine  Review of Systems   Review of Systems  Constitutional: Positive for fatigue. Negative for fever.  Respiratory: Negative for cough and shortness of breath.   Cardiovascular: Negative for chest pain.  Gastrointestinal: Positive for diarrhea. Negative for abdominal pain and vomiting.  Neurological: Positive for weakness. Negative for headaches.  All other systems reviewed and are negative.   Physical Exam Updated Vital Signs BP 109/73 (BP Location: Right Arm)   Pulse 82   Temp (!) 100.5 F (38.1 C) (Rectal)   Resp 12   SpO2 96%   Physical Exam Vitals and nursing note reviewed.  Constitutional:       Appearance: He is well-developed.  HENT:     Head: Normocephalic and atraumatic.     Right Ear: External ear normal.     Left Ear: External ear normal.     Nose: Nose normal.  Eyes:     General:        Right eye: No discharge.        Left eye: No discharge.     Extraocular Movements: Extraocular movements intact.     Pupils: Pupils are equal, round, and reactive to light.  Cardiovascular:     Rate and Rhythm: Regular rhythm. Tachycardia present.     Heart sounds: Normal heart sounds. No murmur heard.     Comments: HR~100 Pulmonary:     Effort: Pulmonary effort is normal.     Breath sounds: Normal breath sounds.  Abdominal:     General: There is no distension.     Palpations: Abdomen is soft.     Tenderness: There is no abdominal tenderness.  Musculoskeletal:     Cervical back: Neck supple.  Skin:    General: Skin is warm and dry.     Findings: No rash.  Neurological:     Mental Status: He is alert and oriented to person, place, and time.     Comments: CN 3-12 grossly intact. 5/5 strength in all 4 extremities. Grossly normal sensation. Normal finger to nose.   Psychiatric:        Mood and Affect: Mood is not anxious.     ED Results / Procedures / Treatments   Labs (all labs ordered are listed, but only abnormal results are displayed) Labs Reviewed  COMPREHENSIVE METABOLIC PANEL - Abnormal; Notable for the following components:      Result Value   CO2 20 (*)    Glucose, Bld 117 (*)    BUN 29 (*)    Creatinine, Ser 1.34 (*)    Total Bilirubin 1.5 (*)    GFR, Estimated 57 (*)    All other components within normal limits  URINALYSIS, ROUTINE W REFLEX MICROSCOPIC - Abnormal; Notable for the following components:   Hgb urine dipstick SMALL (*)    All other components within normal limits  CBC WITH DIFFERENTIAL/PLATELET - Abnormal; Notable for the following components:   WBC 14.6 (*)    Hemoglobin 12.4 (*)    HCT 35.2 (*)    Neutro Abs 13.0 (*)    Abs Immature  Granulocytes 0.08 (*)    All other components within normal limits  RESP PANEL BY RT-PCR (FLU A&B, COVID) ARPGX2  CULTURE, BLOOD (ROUTINE X 2)  CULTURE, BLOOD (ROUTINE X 2)  URINE CULTURE  LIPASE, BLOOD  LACTIC ACID, PLASMA    EKG None  Radiology DG Chest Portable 1 View  Result Date: 07/08/2020 CLINICAL DATA:  Weakness, low-grade fever,  hypertension, type II diabetes mellitus, former smoker EXAM: PORTABLE CHEST 1 VIEW COMPARISON:  Portable exam 1730 hours without priors for comparison FINDINGS: Normal heart size, mediastinal contours, and pulmonary vascularity. Lungs clear. No infiltrate, pleural effusion, or pneumothorax. Osseous structures unremarkable. IMPRESSION: No acute abnormalities. Electronically Signed   By: Lavonia Dana M.D.   On: 07/08/2020 17:40    Procedures Procedures   Medications Ordered in ED Medications  lactated ringers bolus 1,000 mL (0 mLs Intravenous Stopped 07/08/20 1810)  acetaminophen (TYLENOL) tablet 1,000 mg (1,000 mg Oral Given 07/08/20 1808)    ED Course  I have reviewed the triage vital signs and the nursing notes.  Pertinent labs & imaging results that were available during my care of the patient were reviewed by me and considered in my medical decision making (see chart for details).    MDM Rules/Calculators/A&P                          Patient is found to have a low-grade temperature of 100.5.  However there is no obvious source of infection.  He repeatedly has no abdominal pain on exam and has no headache, signs of CNS infection, altered mental status, rash, or obvious bacterial infection.  While he does have a leukocytosis, his lactate is normal and he does not appear ill.  He is feeling better with fluids and Tylenol.  Testing including urine, chest, and COVID/flu testing are all unremarkable.  It is possible that he has occult bacteremia but given his well appearance I think is reasonable to discharge him home with supportive care and follow-up  with PCP with strict return precautions. Final Clinical Impression(s) / ED Diagnoses Final diagnoses:  Fever in adult    Rx / DC Orders ED Discharge Orders    None       Sherwood Gambler, MD 07/08/20 1940

## 2020-07-09 DIAGNOSIS — N35919 Unspecified urethral stricture, male, unspecified site: Secondary | ICD-10-CM | POA: Diagnosis not present

## 2020-07-09 LAB — CULTURE, BLOOD (ROUTINE X 2): Special Requests: ADEQUATE

## 2020-07-10 LAB — URINE CULTURE: Culture: <10000 — AB

## 2020-07-11 LAB — CULTURE, BLOOD (ROUTINE X 2)
Culture: NO GROWTH
Culture: NO GROWTH

## 2020-07-13 LAB — CULTURE, BLOOD (ROUTINE X 2): Special Requests: ADEQUATE

## 2020-07-18 DIAGNOSIS — K219 Gastro-esophageal reflux disease without esophagitis: Secondary | ICD-10-CM | POA: Diagnosis not present

## 2020-07-18 DIAGNOSIS — M179 Osteoarthritis of knee, unspecified: Secondary | ICD-10-CM | POA: Diagnosis not present

## 2020-07-18 DIAGNOSIS — E7800 Pure hypercholesterolemia, unspecified: Secondary | ICD-10-CM | POA: Diagnosis not present

## 2020-07-18 DIAGNOSIS — E1169 Type 2 diabetes mellitus with other specified complication: Secondary | ICD-10-CM | POA: Diagnosis not present

## 2020-07-18 DIAGNOSIS — I10 Essential (primary) hypertension: Secondary | ICD-10-CM | POA: Diagnosis not present

## 2020-08-24 ENCOUNTER — Encounter (HOSPITAL_COMMUNITY): Payer: Self-pay

## 2020-08-24 ENCOUNTER — Emergency Department (HOSPITAL_COMMUNITY)
Admission: EM | Admit: 2020-08-24 | Discharge: 2020-08-24 | Disposition: A | Discharge status: Home / Self Care | Payer: Medicare Other | Attending: Emergency Medicine | Admitting: Emergency Medicine

## 2020-08-24 ENCOUNTER — Other Ambulatory Visit: Payer: Self-pay

## 2020-08-24 ENCOUNTER — Emergency Department (HOSPITAL_COMMUNITY): Payer: Medicare Other

## 2020-08-24 DIAGNOSIS — Z7984 Long term (current) use of oral hypoglycemic drugs: Secondary | ICD-10-CM | POA: Insufficient documentation

## 2020-08-24 DIAGNOSIS — Z85048 Personal history of other malignant neoplasm of rectum, rectosigmoid junction, and anus: Secondary | ICD-10-CM | POA: Diagnosis not present

## 2020-08-24 DIAGNOSIS — Z87891 Personal history of nicotine dependence: Secondary | ICD-10-CM | POA: Insufficient documentation

## 2020-08-24 DIAGNOSIS — R509 Fever, unspecified: Secondary | ICD-10-CM | POA: Diagnosis not present

## 2020-08-24 DIAGNOSIS — R5383 Other fatigue: Secondary | ICD-10-CM | POA: Diagnosis present

## 2020-08-24 DIAGNOSIS — Z79899 Other long term (current) drug therapy: Secondary | ICD-10-CM | POA: Insufficient documentation

## 2020-08-24 DIAGNOSIS — U071 COVID-19: Secondary | ICD-10-CM | POA: Diagnosis not present

## 2020-08-24 DIAGNOSIS — I10 Essential (primary) hypertension: Secondary | ICD-10-CM | POA: Diagnosis not present

## 2020-08-24 DIAGNOSIS — E119 Type 2 diabetes mellitus without complications: Secondary | ICD-10-CM | POA: Diagnosis not present

## 2020-08-24 DIAGNOSIS — Z7982 Long term (current) use of aspirin: Secondary | ICD-10-CM | POA: Insufficient documentation

## 2020-08-24 DIAGNOSIS — M545 Low back pain, unspecified: Secondary | ICD-10-CM | POA: Diagnosis not present

## 2020-08-24 LAB — COMPREHENSIVE METABOLIC PANEL
ALT: 12 U/L (ref 0–44)
AST: 19 U/L (ref 15–41)
Albumin: 4.2 g/dL (ref 3.5–5.0)
Alkaline Phosphatase: 58 U/L (ref 38–126)
Anion gap: 9 (ref 5–15)
BUN: 28 mg/dL — ABNORMAL HIGH (ref 8–23)
CO2: 25 mmol/L (ref 22–32)
Calcium: 9.4 mg/dL (ref 8.9–10.3)
Chloride: 103 mmol/L (ref 98–111)
Creatinine, Ser: 1.51 mg/dL — ABNORMAL HIGH (ref 0.61–1.24)
GFR, Estimated: 49 mL/min — ABNORMAL LOW (ref >60)
Glucose, Bld: 109 mg/dL — ABNORMAL HIGH (ref 70–99)
Potassium: 4.3 mmol/L (ref 3.5–5.1)
Sodium: 137 mmol/L (ref 135–145)
Total Bilirubin: 0.9 mg/dL (ref 0.3–1.2)
Total Protein: 7.1 g/dL (ref 6.5–8.1)

## 2020-08-24 LAB — URINALYSIS, ROUTINE W REFLEX MICROSCOPIC
Bilirubin Urine: NEGATIVE
Glucose, UA: NEGATIVE mg/dL
Hgb urine dipstick: NEGATIVE
Ketones, ur: NEGATIVE mg/dL
Leukocytes,Ua: NEGATIVE
Nitrite: NEGATIVE
Protein, ur: NEGATIVE mg/dL
Specific Gravity, Urine: 1.017 (ref 1.005–1.030)
pH: 5 (ref 5.0–8.0)

## 2020-08-24 LAB — CBC WITH DIFFERENTIAL/PLATELET
Abs Immature Granulocytes: 0.01 10*3/uL (ref 0.00–0.07)
Basophils Absolute: 0 10*3/uL (ref 0.0–0.1)
Basophils Relative: 0 %
Eosinophils Absolute: 0.1 10*3/uL (ref 0.0–0.5)
Eosinophils Relative: 1 %
HCT: 33.5 % — ABNORMAL LOW (ref 39.0–52.0)
Hemoglobin: 11.6 g/dL — ABNORMAL LOW (ref 13.0–17.0)
Immature Granulocytes: 0 %
Lymphocytes Relative: 13 %
Lymphs Abs: 0.8 10*3/uL (ref 0.7–4.0)
MCH: 28.6 pg (ref 26.0–34.0)
MCHC: 34.6 g/dL (ref 30.0–36.0)
MCV: 82.5 fL (ref 80.0–100.0)
Monocytes Absolute: 0.5 10*3/uL (ref 0.1–1.0)
Monocytes Relative: 8 %
Neutro Abs: 5 10*3/uL (ref 1.7–7.7)
Neutrophils Relative %: 78 %
Platelets: 239 10*3/uL (ref 150–400)
RBC: 4.06 MIL/uL — ABNORMAL LOW (ref 4.22–5.81)
RDW: 14.2 % (ref 11.5–15.5)
WBC: 6.4 10*3/uL (ref 4.0–10.5)
nRBC: 0 % (ref 0.0–0.2)

## 2020-08-24 LAB — RESP PANEL BY RT-PCR (FLU A&B, COVID) ARPGX2
Influenza A by PCR: NEGATIVE
Influenza B by PCR: NEGATIVE
SARS Coronavirus 2 by RT PCR: POSITIVE — AB

## 2020-08-24 LAB — TROPONIN I (HIGH SENSITIVITY): Troponin I (High Sensitivity): 3 ng/L (ref <18)

## 2020-08-24 LAB — CBG MONITORING, ED
Glucose-Capillary: 90 mg/dL (ref 70–99)
Glucose-Capillary: 91 mg/dL (ref 70–99)

## 2020-08-24 LAB — LACTIC ACID, PLASMA: Lactic Acid, Venous: 1 mmol/L (ref 0.5–1.9)

## 2020-08-24 MED ORDER — NIRMATRELVIR/RITONAVIR (PAXLOVID) TABLET (RENAL DOSING): 2.0000 | ORAL_TABLET | Freq: Two times a day (BID) | ORAL | 0 refills | Status: AC

## 2020-08-24 MED ORDER — ACETAMINOPHEN 325 MG PO TABS
650.0000 mg | ORAL_TABLET | Freq: Once | ORAL | Status: AC
  Administered 2020-08-24: 650 mg via ORAL
  Filled 2020-08-24: qty 2

## 2020-08-24 MED ORDER — SODIUM CHLORIDE 0.9 % IV SOLN: INTRAVENOUS | Status: DC

## 2020-08-24 NOTE — ED Notes (Signed)
Given urinal, again informed of need for urine specimen

## 2020-08-24 NOTE — ED Provider Notes (Signed)
[facility] DEPT Provider Note   CSN: [id number] Arrival date & time: 08/24/20  1108     History Chief Complaint  Patient presents with  . Weakness  . Emesis  . Back Pain    Gerald Dolberry. is a 70 y.o. male.  Patient started not feeling well either on Monday or Tuesday with a sore throat.  He has felt fatigued with a slight cough some low back pain.  Occasional episodes of vomiting occasional episode of diarrhea.  No blood in either 1.  Patient felt hot.  Temperature upon arrival here was 100.1 patient has had COVID-vaccine had his second booster on Monday.  Patient has not had the natural infection.  Past medical history significant for hypertension hyperlipidemia and diabetes.  Patient is on Lipitor for the upper lipidemia.  He has not taken any of his medications today.        Past Medical History:  Diagnosis Date  . Arthritis    Knee  . ED (erectile dysfunction)   . Hematuria   . History of rectal or anal cancer    JAN 2000  [place] --  ONCOLOGIST ; DR Benay Spice  . History of urethral stricture   . Hydroureter on right   . Hyperlipidemia   . Hypertension   . Type 2 diabetes mellitus ([place])   . Wears glasses     Patient Active Problem List   Diagnosis Date Noted  . Anal cancer ([place]) 06/03/2012    Past Surgical History:  Procedure Laterality Date  . BOWEL RESECTION  1994   POSTERIOR, patient unaware of this procedure  . COLONOSCOPY  2018  . 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 CYSTO/ DIRECT VISION INTERNAL URETHROTOMY  09-22-2007  . CYSTOSCOPY W/ RETROGRADES Bilateral 11/23/2013   Procedure: CYSTOSCOPY WITH RETROGRADE PYELOGRAM,  URETHRAL DILATION;  Surgeon: Alexis Frock, MD;  Location: [facility];  Service: Urology;  Laterality: Bilateral;  . CYSTOSCOPY WITH URETHRAL DILATATION N/A 12/23/2017   Procedure: CYSTOSCOPY WITH BALLOON URETHRAL  DILATATION;  Surgeon: Alexis Frock, MD;  Location: [facility];  Service: Urology;  Laterality: N/A;       Family History  Problem Relation Age of Onset  . Hypertension Mother     Social History   Tobacco Use  . Smoking status: Former Smoker    Years: 10.00    Types: Cigarettes    Quit date: 11/16/1993    Years since quitting: 26.7  . Smokeless tobacco: Never Used  Vaping Use  . Vaping Use: Never used  Substance Use Topics  . Alcohol use: No  . Drug use: No    Home Medications Prior to Admission medications   Medication Sig Start Date End Date Taking? Authorizing Provider  amLODipine (NORVASC) 10 MG tablet Take 10 mg by mouth daily.   Yes [provider]  aspirin 81 MG tablet Take 81 mg by mouth daily.    Yes [provider]  atorvastatin (LIPITOR) 10 MG tablet Take 10 mg by mouth every morning.    Yes [provider]  Cyanocobalamin (VITAMIN B12) 1000 MCG TBCR Take 1,000 mcg by mouth daily. 08/27/15  Yes [provider]  metFORMIN (GLUCOPHAGE) 1000 MG tablet Take 1,000 mg by mouth 2 (two) times daily with a meal.   Yes [provider]  nirmatrelvir/ritonavir EUA, renal dosing, (PAXLOVID) TABS Take 2 tablets by  mouth 2 (two) times daily for 5 days. Patient GFR is 49. Take nirmatrelvir (150 mg) one tablet twice daily for 5 days and ritonavir (100 mg) one tablet twice daily for 5 days. 08/24/20 08/29/20 Yes Gerald Sorrow, MD  telmisartan-hydrochlorothiazide (MICARDIS HCT) 80-12.5 MG tablet Take 1 tablet by mouth daily. 06/22/20  Yes [provider]  senna-docusate (SENOKOT-S) 8.6-50 MG per tablet Take 1 tablet by mouth 2 (two) times daily. While taking pain meds to prevent constipation Patient not taking: No sig reported 11/23/13   Alexis Frock, MD    Allergies    Ivp dye [iodinated diagnostic agents], Lisinopril, and Promethazine  Review of Systems   Review of Systems  Constitutional: Positive for  fatigue and fever. Negative for chills.  HENT: Negative for rhinorrhea and sore throat.   Eyes: Negative for visual disturbance.  Respiratory: Negative for cough and shortness of breath.   Cardiovascular: Negative for chest pain and leg swelling.  Gastrointestinal: Positive for nausea and vomiting. Negative for abdominal pain and diarrhea.  Genitourinary: Negative for dysuria.  Musculoskeletal: Negative for back pain and neck pain.  Skin: Negative for rash.  Neurological: Negative for dizziness, light-headedness and headaches.  Hematological: Does not bruise/bleed easily.  Psychiatric/Behavioral: Negative for confusion.    Physical Exam Updated Vital Signs BP 101/67   Pulse 67   Temp 98.6 F (37 C) (Oral)   Resp 19   Ht 1.791 m (5' 10.5")   Wt 77.1 kg   SpO2 97%   BMI 24.05 kg/m   Physical Exam Vitals and nursing note reviewed.  Constitutional:      General: He is not in acute distress.    Appearance: Normal appearance. He is well-developed. He is not ill-appearing.  HENT:     Head: Normocephalic and atraumatic.  Eyes:     Extraocular Movements: Extraocular movements intact.     Conjunctiva/sclera: Conjunctivae normal.     Pupils: Pupils are equal, round, and reactive to light.  Cardiovascular:     Rate and Rhythm: Normal rate and regular rhythm.     Heart sounds: No murmur heard.   Pulmonary:     Effort: Pulmonary effort is normal. No respiratory distress.     Breath sounds: Normal breath sounds. No wheezing.  Abdominal:     General: There is no distension.     Palpations: Abdomen is soft.     Tenderness: There is no abdominal tenderness. There is no guarding.  Musculoskeletal:        General: No swelling. Normal range of motion.     Cervical back: Neck supple.     Right lower leg: No edema.     Left lower leg: No edema.  Skin:    General: Skin is warm and dry.     Capillary Refill: Capillary refill takes less than 2 seconds.  Neurological:     General: No  focal deficit present.     Mental Status: He is alert and oriented to person, place, and time.     Cranial Nerves: No cranial nerve deficit.     Sensory: No sensory deficit.     Motor: No weakness.     ED Results / Procedures / Treatments   Labs (all labs ordered are listed, but only abnormal results are displayed) Labs Reviewed  RESP PANEL BY RT-PCR (FLU A&B, COVID) ARPGX2 - Abnormal; Notable for the following components:      Result Value   SARS Coronavirus 2 by RT PCR POSITIVE (*)  All other components within normal limits  COMPREHENSIVE METABOLIC PANEL - Abnormal; Notable for the following components:   Glucose, Bld 109 (*)    BUN 28 (*)    Creatinine, Ser 1.51 (*)    GFR, Estimated 49 (*)    All other components within normal limits  CBC WITH DIFFERENTIAL/PLATELET - Abnormal; Notable for the following components:   RBC 4.06 (*)    Hemoglobin 11.6 (*)    HCT 33.5 (*)    All other components within normal limits  CULTURE, BLOOD (ROUTINE X 2)  CULTURE, BLOOD (ROUTINE X 2)  URINE CULTURE  LACTIC ACID, PLASMA  URINALYSIS, ROUTINE W REFLEX MICROSCOPIC  CBG MONITORING, ED  CBG MONITORING, ED  TROPONIN I (HIGH SENSITIVITY)    EKG EKG Interpretation  Date/Time:  Friday Aug 24 2020 13:09:55 EDT Ventricular Rate:  74 PR Interval:  141 QRS Duration: 94 QT Interval:  359 QTC Calculation: 399 R Axis:   -49 Text Interpretation: Sinus rhythm LAD, consider left anterior fascicular block Confirmed by Gerald Knight [phone]) on 08/24/2020 1:42:43 PM   Radiology DG Chest 2 View  Result Date: 08/24/2020 CLINICAL DATA:  Fever EXAM: CHEST - 2 VIEW COMPARISON:  July 08, 2020 FINDINGS: The cardiomediastinal silhouette is unchanged in contour. No pleural effusion. No pneumothorax. No acute pleuroparenchymal abnormality. Visualized abdomen is unremarkable. Mild multilevel degenerative changes of the thoracic spine. IMPRESSION: No acute cardiopulmonary abnormality. 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 There is normal alignment. There is no evidence for acute fracture or subluxation. Mild intervertebral disc space height loss at L4-5 with mild endplate proliferative changes. Vascular calcifications. IMPRESSION: Mild degenerative changes of L4-5. 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 Electronically Signed   By: Valentino Saxon MD   On: 08/24/2020 14:09    Procedures Procedures   Medications Ordered in ED Medications  0.9 %  sodium chloride infusion ( Intravenous New Bag/Given 08/24/20 1355)  acetaminophen (TYLENOL) tablet 650 mg (650 mg Oral Given 08/24/20 1355)    ED Course  I have reviewed the triage vital signs and the nursing notes.  Pertinent labs & imaging results that were available during my care of the patient were reviewed by me and considered in my medical decision making (see chart for details).    MDM Rules/Calculators/A&P                          Patient's COVID test is positive.  Patient has had full vaccination series and both boosters.  Second booster was just on Monday.  Patient had negative COVID test in March.  Patient's GFR is 49.  Electrolytes without significant abnormalities. No leukocytosis.  Chest x-ray negative.  Oxygen saturations been in the upper 90s on room air.  His blood pressure has been above 90 the whole time he is here.  Not tachycardic.  Some blood pressures have been in the low 100s others have been in the upper 0000000 systolic.  Patient not toxic.  Patient meets criteria for oral antiviral.  He will receive the renal dose of Paxlovid.  Patient has not had his Lipitor today and he will continue to hold it  while  taking the oral antiviral.    Final Clinical Impression(s) / ED Diagnoses Final diagnoses:  COVID    Rx / DC Orders ED Discharge Orders         Ordered    nirmatrelvir/ritonavir EUA, renal dosing, (PAXLOVID) TABS  2 times daily        08/24/20 1643           Gerald Sorrow, MD 08/24/20 1657

## 2020-08-24 NOTE — Discharge Instructions (Signed)
COVID test was positive.  Start the antiviral medication Paxlovid.  Start taking it today.  Do not take your statin Lipitor while you are taking this antiviral.  Return for any new or worse symptoms.  Return for shortness of breath or difficulty breathing.

## 2020-08-24 NOTE — ED Provider Notes (Addendum)
Emergency Medicine Provider Triage Evaluation Note  Gerald Knight. , a 70 y.o. male  was evaluated in triage.  Pt complains of feeling dehydrated, low energy level, onset a few days ago, not getting worse but not improving. Vomiting x 1 2 nights ago, also diarrhea, this has resolved. No CP, SHOB, abdominal pain. Similar symptoms a few months ago, seen by EMS and told had a slight temp at that time, did not seek further work up. Has not checked BS today.   Review of Systems  Positive: Fatigue Negative: CP, SHOB  Physical Exam  There were no vitals taken for this visit. Gen:   Awake, no distress   Resp:  Normal effort  MSK:   Moves extremities without difficulty  Other:  No TTP low back  Medical Decision Making  Medically screening exam initiated at 12:45 PM.  Appropriate orders placed.  Gerald Knight. was informed that the remainder of the evaluation will be completed by another provider, this initial triage assessment does not replace that evaluation, and the importance of remaining in the ED until their evaluation is complete.  Difficult historian, does not easily offer information. Found to have temp of 100.1. At end of triage reports low back pain, no falls.      Tacy Learn, PA-C 08/24/20 1248    Tacy Learn, PA-C 08/24/20 1255    Fredia Sorrow, MD 08/24/20 1323

## 2020-08-24 NOTE — ED Notes (Signed)
Patient transported to X-ray 

## 2020-08-24 NOTE — ED Triage Notes (Addendum)
Patient c/o having "low energy" and states he had one episode of vomiting and one episode diarrhea 2 days ago. Patient states he had this same feeling 2 months ago.  Patient added that he is having bilateral lower back pain x 2 days.

## 2020-08-26 LAB — URINE CULTURE: Culture: NO GROWTH

## 2020-08-29 LAB — CULTURE, BLOOD (ROUTINE X 2)
Culture: NO GROWTH
Culture: NO GROWTH
Special Requests: ADEQUATE
Special Requests: ADEQUATE

## 2020-09-06 DIAGNOSIS — U071 COVID-19: Secondary | ICD-10-CM | POA: Diagnosis not present

## 2020-09-25 DIAGNOSIS — Z0000 Encounter for general adult medical examination without abnormal findings: Secondary | ICD-10-CM | POA: Diagnosis not present

## 2020-09-25 DIAGNOSIS — E1169 Type 2 diabetes mellitus with other specified complication: Secondary | ICD-10-CM | POA: Diagnosis not present

## 2020-09-25 DIAGNOSIS — Z7984 Long term (current) use of oral hypoglycemic drugs: Secondary | ICD-10-CM | POA: Diagnosis not present

## 2020-09-25 DIAGNOSIS — K219 Gastro-esophageal reflux disease without esophagitis: Secondary | ICD-10-CM | POA: Diagnosis not present

## 2020-09-25 DIAGNOSIS — I10 Essential (primary) hypertension: Secondary | ICD-10-CM | POA: Diagnosis not present

## 2020-09-25 DIAGNOSIS — E7800 Pure hypercholesterolemia, unspecified: Secondary | ICD-10-CM | POA: Diagnosis not present

## 2020-09-25 DIAGNOSIS — E538 Deficiency of other specified B group vitamins: Secondary | ICD-10-CM | POA: Diagnosis not present

## 2020-09-25 DIAGNOSIS — Z1159 Encounter for screening for other viral diseases: Secondary | ICD-10-CM | POA: Diagnosis not present

## 2020-09-25 DIAGNOSIS — Z1389 Encounter for screening for other disorder: Secondary | ICD-10-CM | POA: Diagnosis not present

## 2020-10-18 DIAGNOSIS — E1169 Type 2 diabetes mellitus with other specified complication: Secondary | ICD-10-CM | POA: Diagnosis not present

## 2020-10-18 DIAGNOSIS — E7800 Pure hypercholesterolemia, unspecified: Secondary | ICD-10-CM | POA: Diagnosis not present

## 2020-10-18 DIAGNOSIS — I10 Essential (primary) hypertension: Secondary | ICD-10-CM | POA: Diagnosis not present

## 2020-10-18 DIAGNOSIS — K219 Gastro-esophageal reflux disease without esophagitis: Secondary | ICD-10-CM | POA: Diagnosis not present

## 2020-10-18 DIAGNOSIS — M179 Osteoarthritis of knee, unspecified: Secondary | ICD-10-CM | POA: Diagnosis not present

## 2020-12-31 DIAGNOSIS — K219 Gastro-esophageal reflux disease without esophagitis: Secondary | ICD-10-CM | POA: Diagnosis not present

## 2020-12-31 DIAGNOSIS — E1169 Type 2 diabetes mellitus with other specified complication: Secondary | ICD-10-CM | POA: Diagnosis not present

## 2020-12-31 DIAGNOSIS — I10 Essential (primary) hypertension: Secondary | ICD-10-CM | POA: Diagnosis not present

## 2020-12-31 DIAGNOSIS — E7800 Pure hypercholesterolemia, unspecified: Secondary | ICD-10-CM | POA: Diagnosis not present

## 2021-03-06 DIAGNOSIS — E119 Type 2 diabetes mellitus without complications: Secondary | ICD-10-CM | POA: Diagnosis not present

## 2021-03-06 DIAGNOSIS — H2513 Age-related nuclear cataract, bilateral: Secondary | ICD-10-CM | POA: Diagnosis not present

## 2021-03-08 DIAGNOSIS — R0981 Nasal congestion: Secondary | ICD-10-CM | POA: Diagnosis not present

## 2021-03-08 DIAGNOSIS — Z03818 Encounter for observation for suspected exposure to other biological agents ruled out: Secondary | ICD-10-CM | POA: Diagnosis not present

## 2021-03-08 DIAGNOSIS — R5383 Other fatigue: Secondary | ICD-10-CM | POA: Diagnosis not present

## 2021-03-08 DIAGNOSIS — J069 Acute upper respiratory infection, unspecified: Secondary | ICD-10-CM | POA: Diagnosis not present

## 2021-03-28 DIAGNOSIS — Z23 Encounter for immunization: Secondary | ICD-10-CM | POA: Diagnosis not present

## 2021-03-28 DIAGNOSIS — M79675 Pain in left toe(s): Secondary | ICD-10-CM | POA: Diagnosis not present

## 2021-03-28 DIAGNOSIS — E1169 Type 2 diabetes mellitus with other specified complication: Secondary | ICD-10-CM | POA: Diagnosis not present

## 2021-03-28 DIAGNOSIS — I10 Essential (primary) hypertension: Secondary | ICD-10-CM | POA: Diagnosis not present

## 2021-04-11 DIAGNOSIS — E7800 Pure hypercholesterolemia, unspecified: Secondary | ICD-10-CM | POA: Diagnosis not present

## 2021-04-11 DIAGNOSIS — E1169 Type 2 diabetes mellitus with other specified complication: Secondary | ICD-10-CM | POA: Diagnosis not present

## 2021-04-11 DIAGNOSIS — I10 Essential (primary) hypertension: Secondary | ICD-10-CM | POA: Diagnosis not present

## 2021-04-11 DIAGNOSIS — K219 Gastro-esophageal reflux disease without esophagitis: Secondary | ICD-10-CM | POA: Diagnosis not present

## 2021-07-08 DIAGNOSIS — N35919 Unspecified urethral stricture, male, unspecified site: Secondary | ICD-10-CM | POA: Diagnosis not present

## 2021-09-27 DIAGNOSIS — E7800 Pure hypercholesterolemia, unspecified: Secondary | ICD-10-CM | POA: Diagnosis not present

## 2021-09-27 DIAGNOSIS — Z0000 Encounter for general adult medical examination without abnormal findings: Secondary | ICD-10-CM | POA: Diagnosis not present

## 2021-09-27 DIAGNOSIS — M179 Osteoarthritis of knee, unspecified: Secondary | ICD-10-CM | POA: Diagnosis not present

## 2021-09-27 DIAGNOSIS — E1169 Type 2 diabetes mellitus with other specified complication: Secondary | ICD-10-CM | POA: Diagnosis not present

## 2021-09-27 DIAGNOSIS — L309 Dermatitis, unspecified: Secondary | ICD-10-CM | POA: Diagnosis not present

## 2021-09-27 DIAGNOSIS — I10 Essential (primary) hypertension: Secondary | ICD-10-CM | POA: Diagnosis not present

## 2021-10-09 IMAGING — CR DG CHEST 2V
2 series · 2 of 2 positions shown · non-contrast
Comparison: July 08, 2020

CLINICAL DATA: Fever

EXAM:
CHEST - 2 VIEW

[w chest lat]
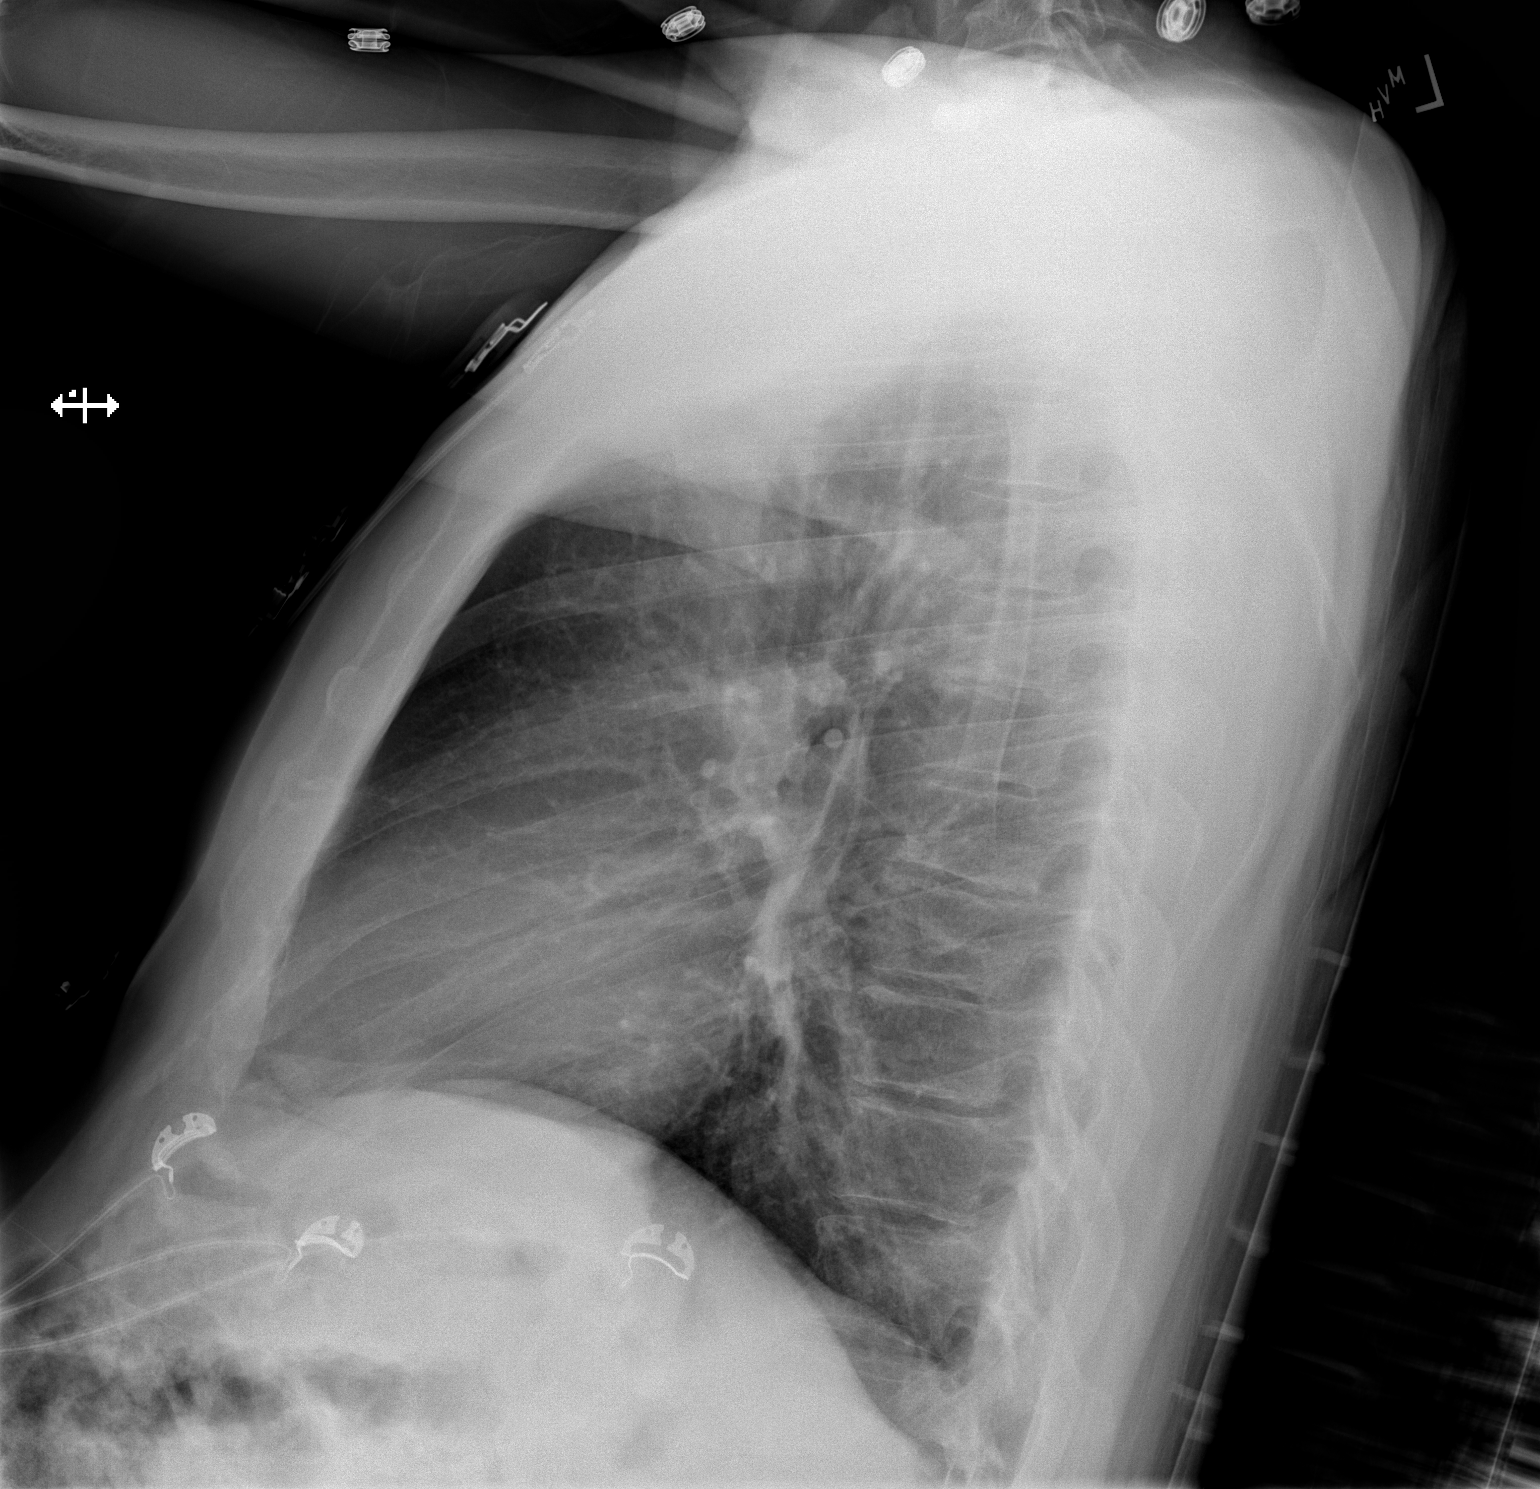

[x chest ap]
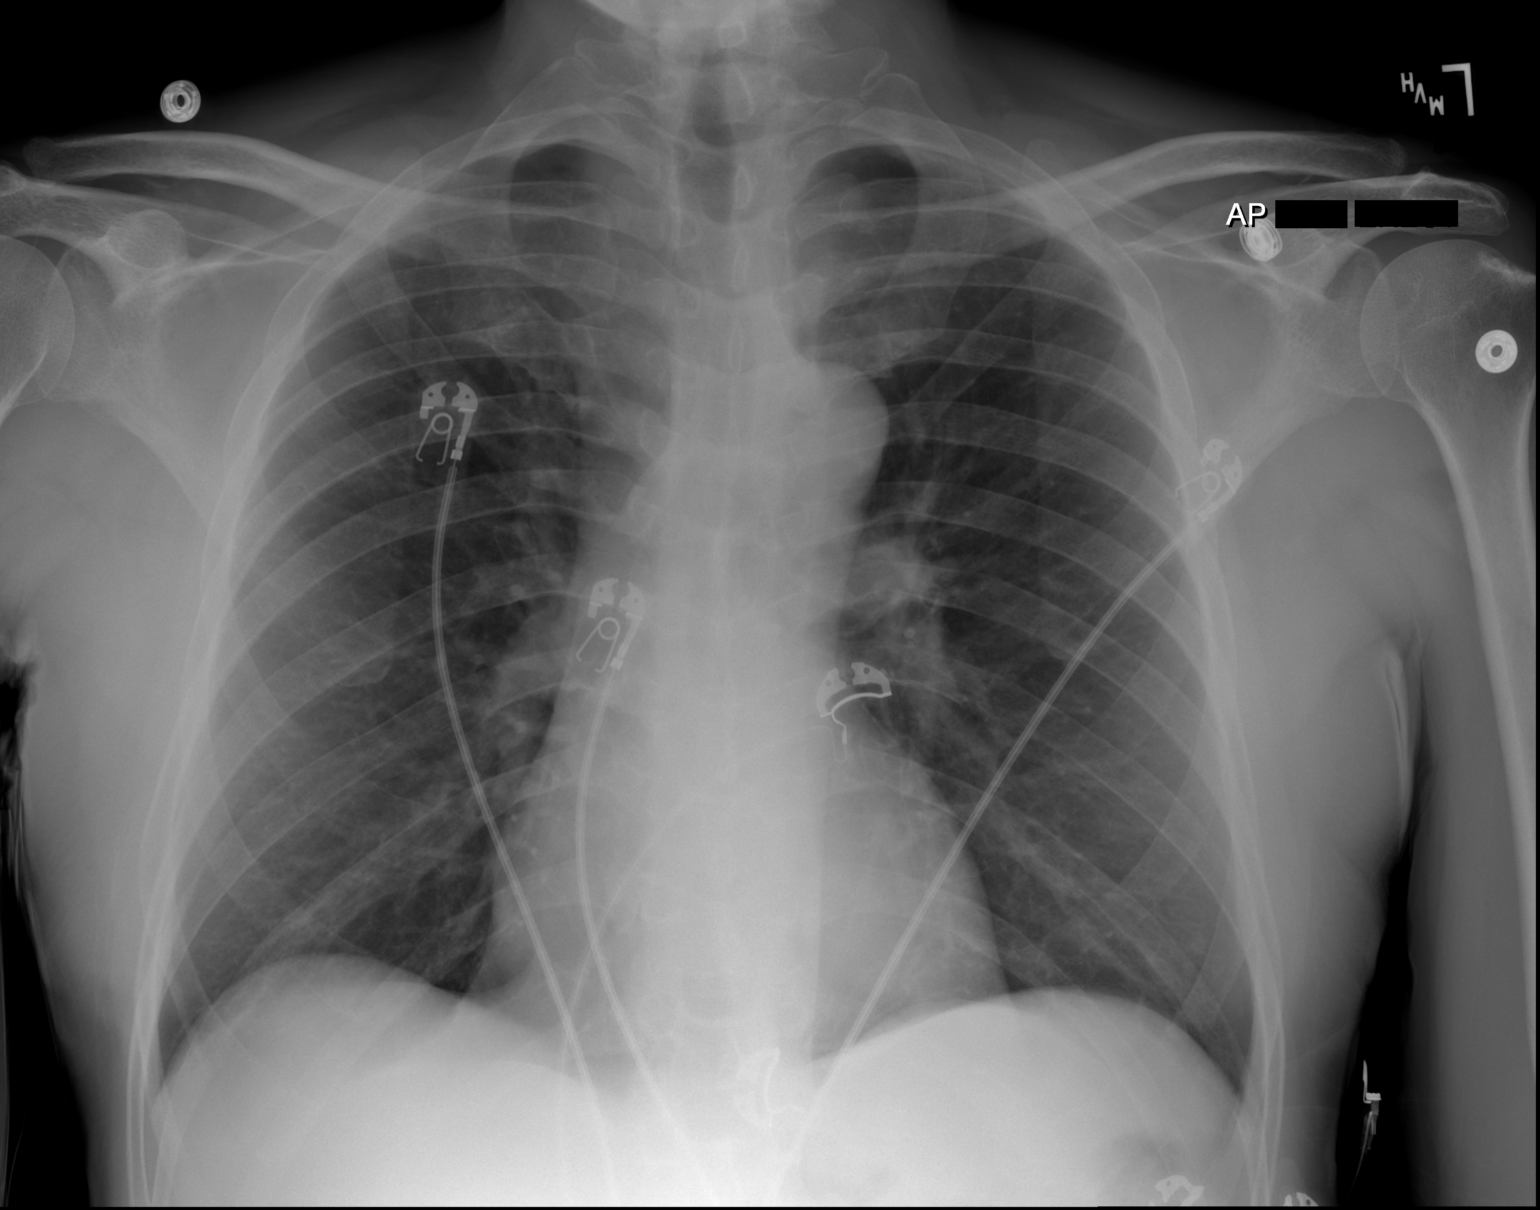

[2 of 2 positions shown; findings below may reference images not displayed]

FINDINGS: The cardiomediastinal silhouette is unchanged in contour. No pleural
effusion. No pneumothorax. No acute pleuroparenchymal abnormality.
Visualized abdomen is unremarkable. Mild multilevel degenerative
changes of the thoracic spine.
IMPRESSION: No acute cardiopulmonary abnormality.

## 2021-12-06 ENCOUNTER — Other Ambulatory Visit: Payer: Self-pay

## 2021-12-06 ENCOUNTER — Encounter (HOSPITAL_COMMUNITY): Payer: Self-pay

## 2021-12-06 ENCOUNTER — Emergency Department (HOSPITAL_COMMUNITY): Payer: Medicare Other

## 2021-12-06 ENCOUNTER — Emergency Department (HOSPITAL_COMMUNITY)
Admission: EM | Admit: 2021-12-06 | Discharge: 2021-12-06 | Disposition: A | Discharge status: Home / Self Care | Payer: Medicare Other | Attending: Emergency Medicine | Admitting: Emergency Medicine

## 2021-12-06 DIAGNOSIS — R531 Weakness: Secondary | ICD-10-CM | POA: Diagnosis not present

## 2021-12-06 DIAGNOSIS — Z79899 Other long term (current) drug therapy: Secondary | ICD-10-CM | POA: Diagnosis not present

## 2021-12-06 DIAGNOSIS — R55 Syncope and collapse: Secondary | ICD-10-CM | POA: Diagnosis not present

## 2021-12-06 DIAGNOSIS — Z7982 Long term (current) use of aspirin: Secondary | ICD-10-CM | POA: Insufficient documentation

## 2021-12-06 DIAGNOSIS — Z7984 Long term (current) use of oral hypoglycemic drugs: Secondary | ICD-10-CM | POA: Insufficient documentation

## 2021-12-06 DIAGNOSIS — R42 Dizziness and giddiness: Secondary | ICD-10-CM | POA: Diagnosis not present

## 2021-12-06 LAB — CBG MONITORING, ED: Glucose-Capillary: 115 mg/dL — ABNORMAL HIGH (ref 70–99)

## 2021-12-06 LAB — CBC
HCT: 34.5 % — ABNORMAL LOW (ref 39.0–52.0)
Hemoglobin: 12 g/dL — ABNORMAL LOW (ref 13.0–17.0)
MCH: 29 pg (ref 26.0–34.0)
MCHC: 34.8 g/dL (ref 30.0–36.0)
MCV: 83.3 fL (ref 80.0–100.0)
Platelets: 265 10*3/uL (ref 150–400)
RBC: 4.14 MIL/uL — ABNORMAL LOW (ref 4.22–5.81)
RDW: 14.4 % (ref 11.5–15.5)
WBC: 6.4 10*3/uL (ref 4.0–10.5)
nRBC: 0 % (ref 0.0–0.2)

## 2021-12-06 LAB — COMPREHENSIVE METABOLIC PANEL
ALT: 11 U/L (ref 0–44)
AST: 19 U/L (ref 15–41)
Albumin: 4.4 g/dL (ref 3.5–5.0)
Alkaline Phosphatase: 66 U/L (ref 38–126)
Anion gap: 9 (ref 5–15)
BUN: 27 mg/dL — ABNORMAL HIGH (ref 8–23)
CO2: 23 mmol/L (ref 22–32)
Calcium: 9.8 mg/dL (ref 8.9–10.3)
Chloride: 109 mmol/L (ref 98–111)
Creatinine, Ser: 1.56 mg/dL — ABNORMAL HIGH (ref 0.61–1.24)
GFR, Estimated: 47 mL/min — ABNORMAL LOW (ref >60)
Glucose, Bld: 111 mg/dL — ABNORMAL HIGH (ref 70–99)
Potassium: 3.9 mmol/L (ref 3.5–5.1)
Sodium: 141 mmol/L (ref 135–145)
Total Bilirubin: 1 mg/dL (ref 0.3–1.2)
Total Protein: 7.1 g/dL (ref 6.5–8.1)

## 2021-12-06 LAB — URINALYSIS, ROUTINE W REFLEX MICROSCOPIC
Bacteria, UA: NONE SEEN
Bilirubin Urine: NEGATIVE
Glucose, UA: NEGATIVE mg/dL
Hgb urine dipstick: NEGATIVE
Ketones, ur: NEGATIVE mg/dL
Nitrite: NEGATIVE
Protein, ur: NEGATIVE mg/dL
Specific Gravity, Urine: 1.023 (ref 1.005–1.030)
pH: 5 (ref 5.0–8.0)

## 2021-12-06 MED ORDER — LACTATED RINGERS IV BOLUS
1000.0000 mL | Freq: Once | INTRAVENOUS | Status: AC
  Administered 2021-12-06: 1000 mL via INTRAVENOUS

## 2021-12-06 NOTE — ED Notes (Signed)
Pt ambulated well in hallway to restroom - no issues

## 2021-12-06 NOTE — ED Triage Notes (Signed)
Pt arrives POV after a syncopal episode at home.   Sts he got up from recliner went to bedroom to take some meds and passed out falling onto the bed. Denies pain.   EMS evaluation at home recommended coming to ER.

## 2021-12-06 NOTE — ED Provider Notes (Signed)
[facility] DEPT Provider Note   CSN: [id number] Arrival date & time: 12/06/21  0133     History  Chief Complaint  Patient presents with   Syncopal Episode    Gerald Paci. is a 71 y.o. male.  71 yo M here with a near syncopal episode.  In his normal state of health throughout the day yesterday did do some work outside and drink as much fluid as he normally would.  Patient was in the chair in his living room and got up to walk to the bedroom so he could get his medications sorted for the next day and he felt a little bit lightheaded on the way.  When he got there as he was sort his medications he had an episode where he got significant lightheaded and fell forward on the bed.  His wife was there and called his name few times he states he heard her calling his name but did not feel the ability to respond secondary to his weakness and dizziness.  Patient was then able to be helped ambulated to another room and quickly returned to normal.  Patient feels normal at this time.  No surrounding chest pain, abdominal pain, headache, blurry vision, paresthesias or weakness.        Home Medications Prior to Admission medications   Medication Sig Start Date End Date Taking? Authorizing Provider  amLODipine (NORVASC) 10 MG tablet Take 10 mg by mouth daily.   Yes [provider]  aspirin 81 MG tablet Take 81 mg by mouth daily.    Yes [provider]  atorvastatin (LIPITOR) 10 MG tablet Take 10 mg by mouth every morning.    Yes [provider]  Cyanocobalamin (VITAMIN B12) 1000 MCG TBCR Take 1,000 mcg by mouth daily. 08/27/15  Yes [provider]  metFORMIN (GLUCOPHAGE) 1000 MG tablet Take 1,000 mg by mouth 2 (two) times daily with a meal.   Yes [provider]  telmisartan-hydrochlorothiazide (MICARDIS HCT) 80-12.5 MG tablet Take 1 tablet by mouth daily. 06/22/20  Yes [provider]  senna-docusate (SENOKOT-S)  8.6-50 MG per tablet Take 1 tablet by mouth 2 (two) times daily. While taking pain meds to prevent constipation Patient not taking: Reported on 08/24/2020 11/23/13   Alexis Frock, MD      Allergies    Ivp dye [iodinated contrast media], Lisinopril, and Promethazine    Review of Systems   Review of Systems  Physical Exam Updated Vital Signs BP 104/65   Pulse 69   Temp 97.7 F (36.5 C) (Oral)   Resp 17   Ht '5\' 10"'$  (1.778 m)   Wt 77.1 kg   SpO2 98%   BMI 24.39 kg/m  Physical Exam Vitals and nursing note reviewed.  Constitutional:      Appearance: He is well-developed.  HENT:     Head: Normocephalic and atraumatic.     Mouth/Throat:     Mouth: Mucous membranes are moist.     Pharynx: Oropharynx is clear.  Eyes:     Pupils: Pupils are equal, round, and reactive to light.  Cardiovascular:     Rate and Rhythm: Normal rate.  Pulmonary:     Effort: Pulmonary effort is normal. No respiratory distress.  Abdominal:     General: Abdomen is flat. There is no distension.  Musculoskeletal:        General: Normal range of motion.     Cervical back: Normal range of motion.  Skin:  General: Skin is warm and dry.  Neurological:     Mental Status: He is alert.     ED Results / Procedures / Treatments   Labs (all labs ordered are listed, but only abnormal results are displayed) Labs Reviewed  CBC - Abnormal; Notable for the following components:      Result Value   RBC 4.14 (*)    Hemoglobin 12.0 (*)    HCT 34.5 (*)    All other components within normal limits  URINALYSIS, ROUTINE W REFLEX MICROSCOPIC - Abnormal; Notable for the following components:   Leukocytes,Ua TRACE (*)    All other components within normal limits  COMPREHENSIVE METABOLIC PANEL - Abnormal; Notable for the following components:   Glucose, Bld 111 (*)    BUN 27 (*)    Creatinine, Ser 1.56 (*)    GFR, Estimated 47 (*)    All other components within normal limits  CBG MONITORING, ED - Abnormal;  Notable for the following components:   Glucose-Capillary 115 (*)    All other components within normal limits    EKG None  Radiology DG Chest 2 View  Result Date: 12/06/2021 CLINICAL DATA:  Syncope EXAM: CHEST - 2 VIEW COMPARISON:  08/24/2020 FINDINGS: The heart size and mediastinal contours are within normal limits. Both lungs are clear. The visualized skeletal structures are unremarkable. IMPRESSION: No active cardiopulmonary disease. Electronically Signed   By: Ulyses Jarred M.D.   On: 12/06/2021 03:41    Procedures Procedures    Medications Ordered in ED Medications  lactated ringers bolus 1,000 mL (0 mLs Intravenous Stopped 12/06/21 0537)    ED Course/ Medical Decision Making/ A&P                           Medical Decision Making Amount and/or Complexity of Data Reviewed Labs: ordered. Radiology: ordered.   Labs reviewed myself reassuring.  Chest x-ray reviewed by myself and no obvious widened mediastinum, pulmonary edema or other contributory findings.  Likely orthostatic however his orthostatics here were negative.  Was given a liter of fluids patient able to ambulate without any difficulty.  No recurrent syncope.  We will continue hydration at home and follow-up with PCP to ensure symptoms are resolved and for further testing as needed.   Final Clinical Impression(s) / ED Diagnoses Final diagnoses:  Syncope, unspecified syncope type    Rx / DC Orders ED Discharge Orders     None         Yul Diana, Corene Cornea, MD 12/06/21 [phone]

## 2021-12-23 DIAGNOSIS — R55 Syncope and collapse: Secondary | ICD-10-CM | POA: Diagnosis not present

## 2021-12-23 DIAGNOSIS — D649 Anemia, unspecified: Secondary | ICD-10-CM | POA: Diagnosis not present

## 2021-12-23 DIAGNOSIS — E538 Deficiency of other specified B group vitamins: Secondary | ICD-10-CM | POA: Diagnosis not present

## 2021-12-26 ENCOUNTER — Other Ambulatory Visit (HOSPITAL_COMMUNITY): Payer: Self-pay | Admitting: Internal Medicine

## 2021-12-26 DIAGNOSIS — R55 Syncope and collapse: Secondary | ICD-10-CM

## 2021-12-27 ENCOUNTER — Ambulatory Visit (HOSPITAL_COMMUNITY): Payer: Medicare Other | Attending: Internal Medicine

## 2021-12-27 DIAGNOSIS — R55 Syncope and collapse: Secondary | ICD-10-CM | POA: Diagnosis not present

## 2021-12-27 LAB — ECHOCARDIOGRAM COMPLETE
Area-P 1/2: 3.88 cm2
MV M vel: 5.21 m/s
MV Peak grad: 108.6 mmHg
S' Lateral: 2.4 cm

## 2022-01-04 LAB — GLUCOSE, POCT (MANUAL RESULT ENTRY): POC Glucose: 219 mg/dl — AB (ref 70–99)

## 2022-03-11 DIAGNOSIS — H2513 Age-related nuclear cataract, bilateral: Secondary | ICD-10-CM | POA: Diagnosis not present

## 2022-03-11 DIAGNOSIS — E119 Type 2 diabetes mellitus without complications: Secondary | ICD-10-CM | POA: Diagnosis not present

## 2022-03-19 DIAGNOSIS — H34211 Partial retinal artery occlusion, right eye: Secondary | ICD-10-CM | POA: Diagnosis not present

## 2022-03-19 DIAGNOSIS — E119 Type 2 diabetes mellitus without complications: Secondary | ICD-10-CM | POA: Diagnosis not present

## 2022-03-19 DIAGNOSIS — H2513 Age-related nuclear cataract, bilateral: Secondary | ICD-10-CM | POA: Diagnosis not present

## 2022-04-17 DIAGNOSIS — E7800 Pure hypercholesterolemia, unspecified: Secondary | ICD-10-CM | POA: Diagnosis not present

## 2022-04-17 DIAGNOSIS — L309 Dermatitis, unspecified: Secondary | ICD-10-CM | POA: Diagnosis not present

## 2022-04-17 DIAGNOSIS — D649 Anemia, unspecified: Secondary | ICD-10-CM | POA: Diagnosis not present

## 2022-04-17 DIAGNOSIS — I10 Essential (primary) hypertension: Secondary | ICD-10-CM | POA: Diagnosis not present

## 2022-04-17 DIAGNOSIS — E1169 Type 2 diabetes mellitus with other specified complication: Secondary | ICD-10-CM | POA: Diagnosis not present

## 2022-07-22 DIAGNOSIS — N281 Cyst of kidney, acquired: Secondary | ICD-10-CM | POA: Diagnosis not present

## 2022-07-22 DIAGNOSIS — N35919 Unspecified urethral stricture, male, unspecified site: Secondary | ICD-10-CM | POA: Diagnosis not present

## 2022-10-01 DIAGNOSIS — E1169 Type 2 diabetes mellitus with other specified complication: Secondary | ICD-10-CM | POA: Diagnosis not present

## 2022-10-01 DIAGNOSIS — D649 Anemia, unspecified: Secondary | ICD-10-CM | POA: Diagnosis not present

## 2022-10-01 DIAGNOSIS — I10 Essential (primary) hypertension: Secondary | ICD-10-CM | POA: Diagnosis not present

## 2022-10-01 DIAGNOSIS — E7800 Pure hypercholesterolemia, unspecified: Secondary | ICD-10-CM | POA: Diagnosis not present

## 2022-10-01 DIAGNOSIS — Z79899 Other long term (current) drug therapy: Secondary | ICD-10-CM | POA: Diagnosis not present

## 2022-10-01 DIAGNOSIS — M25561 Pain in right knee: Secondary | ICD-10-CM | POA: Diagnosis not present

## 2022-10-01 DIAGNOSIS — N1831 Chronic kidney disease, stage 3a: Secondary | ICD-10-CM | POA: Diagnosis not present

## 2022-10-01 DIAGNOSIS — Z23 Encounter for immunization: Secondary | ICD-10-CM | POA: Diagnosis not present

## 2022-10-01 DIAGNOSIS — E1122 Type 2 diabetes mellitus with diabetic chronic kidney disease: Secondary | ICD-10-CM | POA: Diagnosis not present

## 2022-10-01 DIAGNOSIS — Z0000 Encounter for general adult medical examination without abnormal findings: Secondary | ICD-10-CM | POA: Diagnosis not present

## 2024-01-14 DIAGNOSIS — S0990XA Unspecified injury of head, initial encounter: Secondary | ICD-10-CM | POA: Diagnosis not present

## 2024-01-14 DIAGNOSIS — W19XXXA Unspecified fall, initial encounter: Secondary | ICD-10-CM | POA: Diagnosis not present

## 2024-01-14 DIAGNOSIS — H9313 Tinnitus, bilateral: Secondary | ICD-10-CM | POA: Diagnosis not present

## 2024-01-15 ENCOUNTER — Other Ambulatory Visit (HOSPITAL_BASED_OUTPATIENT_CLINIC_OR_DEPARTMENT_OTHER): Payer: Self-pay

## 2024-01-15 DIAGNOSIS — S0990XA Unspecified injury of head, initial encounter: Secondary | ICD-10-CM

## 2024-01-16 ENCOUNTER — Ambulatory Visit (HOSPITAL_BASED_OUTPATIENT_CLINIC_OR_DEPARTMENT_OTHER)

## 2024-01-18 ENCOUNTER — Ambulatory Visit
Admission: RE | Admit: 2024-01-18 | Discharge: 2024-01-18 | Disposition: A | Discharge status: Home / Self Care | Source: Ambulatory Visit

## 2024-01-18 DIAGNOSIS — I6523 Occlusion and stenosis of bilateral carotid arteries: Secondary | ICD-10-CM | POA: Diagnosis not present

## 2024-01-18 DIAGNOSIS — S0990XA Unspecified injury of head, initial encounter: Secondary | ICD-10-CM
